# Patient Record
Sex: Male | Born: 1940
Health system: Southern US, Community
[De-identification: ages and names within clinical notes are randomized; demographics above are authoritative.]

## PROBLEM LIST (undated history)

## (undated) DIAGNOSIS — I219 Acute myocardial infarction, unspecified: Secondary | ICD-10-CM

## (undated) DIAGNOSIS — M109 Gout, unspecified: Secondary | ICD-10-CM

## (undated) DIAGNOSIS — E119 Type 2 diabetes mellitus without complications: Secondary | ICD-10-CM

## (undated) DIAGNOSIS — I10 Essential (primary) hypertension: Secondary | ICD-10-CM

## (undated) DIAGNOSIS — I2510 Atherosclerotic heart disease of native coronary artery without angina pectoris: Secondary | ICD-10-CM

## (undated) HISTORY — PX: COLONOSCOPY: SHX5424

## (undated) HISTORY — DX: Gout, unspecified: M10.9

## (undated) HISTORY — DX: Atherosclerotic heart disease of native coronary artery without angina pectoris: I25.10

## (undated) HISTORY — DX: Acute myocardial infarction, unspecified: I21.9

## (undated) HISTORY — PX: CARDIAC CATHETERIZATION: SHX172

---

## 2001-03-07 ENCOUNTER — Emergency Department (HOSPITAL_COMMUNITY): Admission: EM | Admit: 2001-03-07 | Discharge: 2001-03-07 | Payer: Self-pay | Admitting: Emergency Medicine

## 2001-03-07 ENCOUNTER — Encounter: Payer: Self-pay | Admitting: Emergency Medicine

## 2001-06-14 ENCOUNTER — Emergency Department (HOSPITAL_COMMUNITY): Admission: EM | Admit: 2001-06-14 | Discharge: 2001-06-14 | Payer: Self-pay | Admitting: Emergency Medicine

## 2001-06-14 ENCOUNTER — Encounter: Payer: Self-pay | Admitting: Emergency Medicine

## 2004-08-22 ENCOUNTER — Ambulatory Visit: Payer: Self-pay | Admitting: Gastroenterology

## 2004-09-05 ENCOUNTER — Ambulatory Visit: Payer: Self-pay | Admitting: Gastroenterology

## 2004-09-21 ENCOUNTER — Ambulatory Visit: Payer: Self-pay | Admitting: Gastroenterology

## 2005-04-04 ENCOUNTER — Emergency Department (HOSPITAL_COMMUNITY): Admission: EM | Admit: 2005-04-04 | Discharge: 2005-04-04 | Payer: Self-pay | Admitting: Emergency Medicine

## 2005-06-10 ENCOUNTER — Emergency Department (HOSPITAL_COMMUNITY): Admission: EM | Admit: 2005-06-10 | Discharge: 2005-06-11 | Payer: Self-pay | Admitting: Emergency Medicine

## 2005-09-18 ENCOUNTER — Encounter: Admission: RE | Admit: 2005-09-18 | Discharge: 2005-09-18 | Payer: Self-pay | Admitting: Family Medicine

## 2005-09-25 ENCOUNTER — Ambulatory Visit (HOSPITAL_COMMUNITY): Admission: RE | Admit: 2005-09-25 | Discharge: 2005-09-25 | Payer: Self-pay | Admitting: Family Medicine

## 2005-12-30 ENCOUNTER — Encounter: Admission: RE | Admit: 2005-12-30 | Discharge: 2005-12-30 | Payer: Self-pay | Admitting: Family Medicine

## 2005-12-31 ENCOUNTER — Encounter: Admission: RE | Admit: 2005-12-31 | Discharge: 2005-12-31 | Payer: Self-pay | Admitting: Family Medicine

## 2006-06-04 ENCOUNTER — Emergency Department (HOSPITAL_COMMUNITY): Admission: EM | Admit: 2006-06-04 | Discharge: 2006-06-04 | Payer: Self-pay | Admitting: Family Medicine

## 2006-11-29 ENCOUNTER — Emergency Department (HOSPITAL_COMMUNITY): Admission: EM | Admit: 2006-11-29 | Discharge: 2006-11-29 | Payer: Self-pay | Admitting: Emergency Medicine

## 2007-07-09 ENCOUNTER — Inpatient Hospital Stay (HOSPITAL_COMMUNITY): Admission: EM | Admit: 2007-07-09 | Discharge: 2007-07-11 | Payer: Self-pay | Admitting: Emergency Medicine

## 2008-03-18 ENCOUNTER — Ambulatory Visit (HOSPITAL_COMMUNITY): Admission: RE | Admit: 2008-03-18 | Discharge: 2008-03-18 | Payer: Self-pay | Admitting: Orthopaedic Surgery

## 2009-04-14 IMAGING — CR DG CHEST 1V PORT
1 series · 1 of 1 positions shown · non-contrast
Comparison: Two-view chest x-ray 11/29/2006.  CT chest 12/31/2005.

CLINICAL DATA: Chest pain.

PORTABLE CHEST - 1 VIEW [DATE]/[id number] [id number] hours:

[view not recorded]
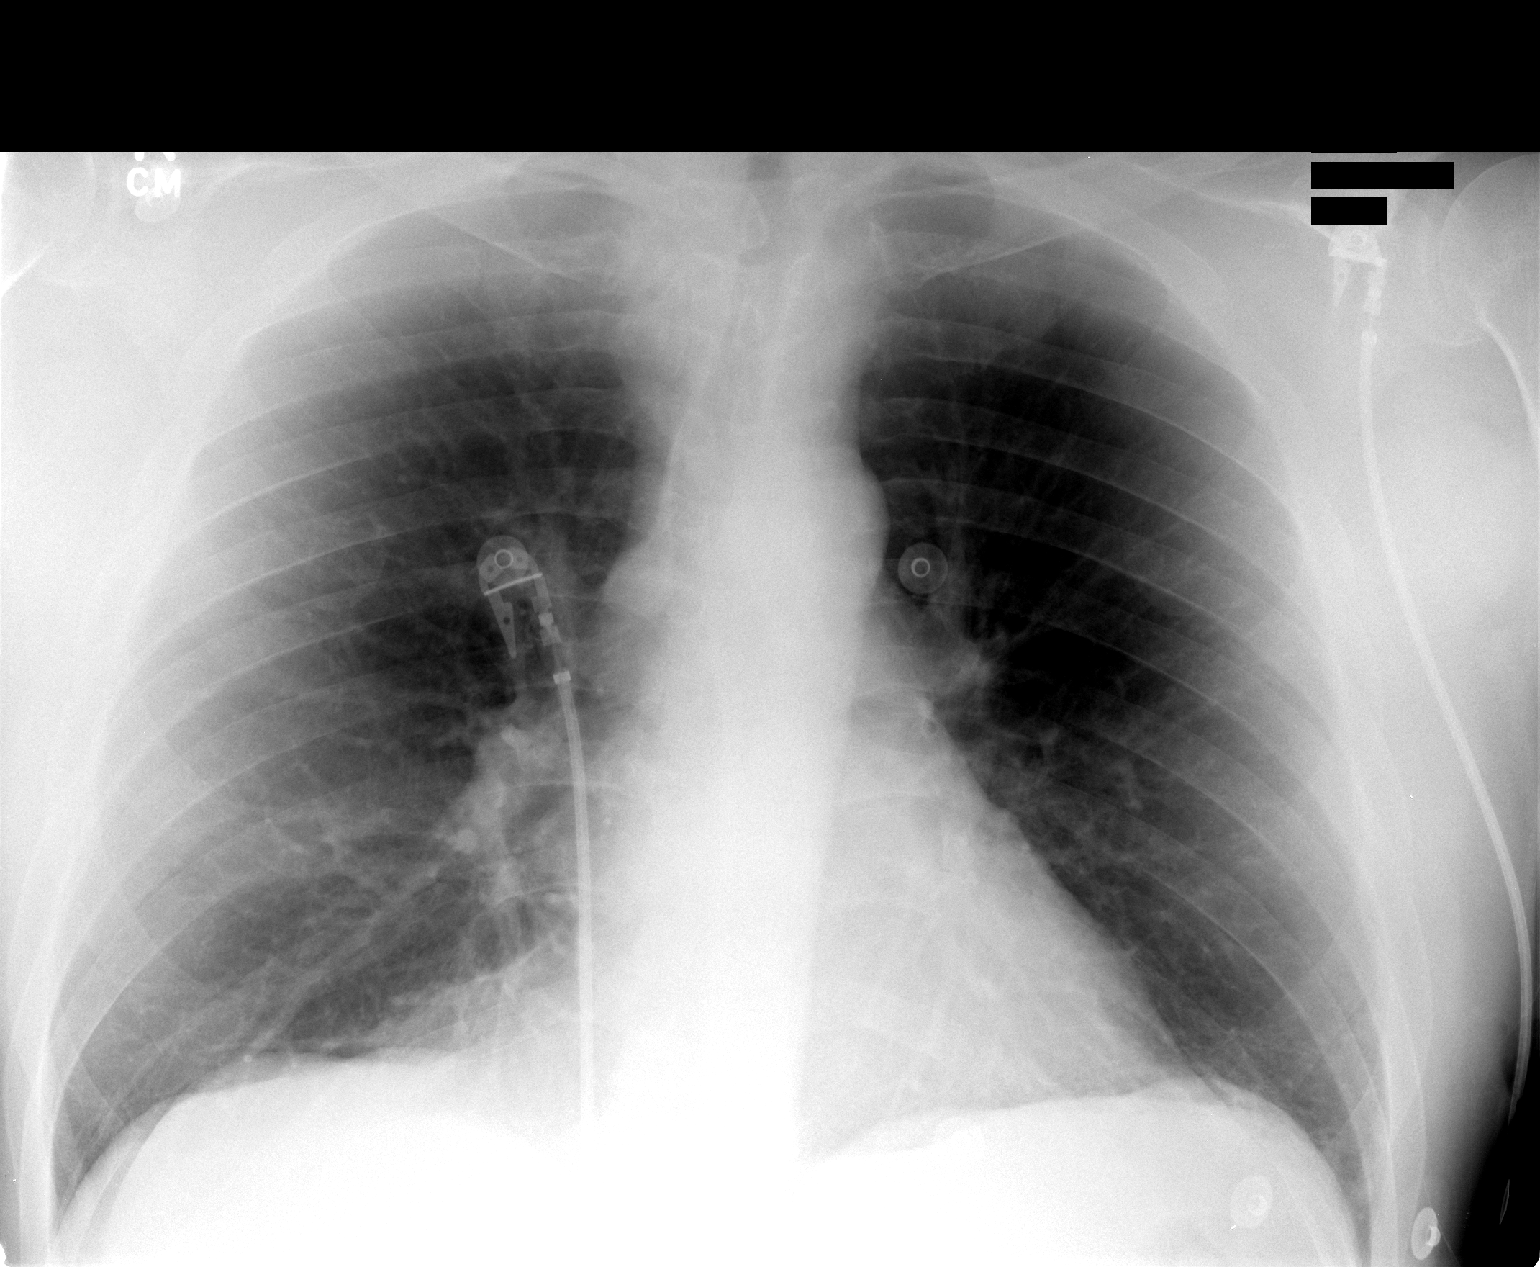

[1 of 1 positions shown; findings below may reference images not displayed]

FINDINGS: Heart size normal for the AP portable technique,
unchanged.  Pulmonary parenchyma clear.  No pleural effusions.
IMPRESSION: No acute cardiopulmonary disease.

## 2010-05-07 ENCOUNTER — Inpatient Hospital Stay (INDEPENDENT_AMBULATORY_CARE_PROVIDER_SITE_OTHER)
Admission: RE | Admit: 2010-05-07 | Discharge: 2010-05-07 | Disposition: A | Discharge status: Home / Self Care | Payer: MEDICARE | Source: Ambulatory Visit | Attending: Emergency Medicine | Admitting: Emergency Medicine

## 2010-05-07 DIAGNOSIS — R209 Unspecified disturbances of skin sensation: Secondary | ICD-10-CM

## 2010-06-25 ENCOUNTER — Other Ambulatory Visit: Payer: Self-pay | Admitting: Orthopedic Surgery

## 2010-06-25 ENCOUNTER — Ambulatory Visit
Admission: RE | Admit: 2010-06-25 | Discharge: 2010-06-25 | Disposition: A | Discharge status: Home / Self Care | Payer: MEDICARE | Source: Ambulatory Visit | Attending: Orthopedic Surgery | Admitting: Orthopedic Surgery

## 2010-06-25 DIAGNOSIS — R52 Pain, unspecified: Secondary | ICD-10-CM

## 2010-08-14 NOTE — Discharge Summary (Signed)
Jeremy Keller, Jeremy Keller                 ACCOUNT NO.:  [id number]   MEDICAL RECORD NO.:  [id number]          PATIENT TYPE:  INP   LOCATION:  2027                         FACILITY:  [facility]   PHYSICIAN:  Jeremy Hilts. Jacinto Halim, MD       DATE OF BIRTH:  [birth year]   DATE OF ADMISSION:  07/09/2007  DATE OF DISCHARGE:  07/11/2007                               DISCHARGE SUMMARY   DISCHARGE DIAGNOSES:  1. Non-ST elevation myocardial infarction.  2. Coronary artery disease with percutaneous transluminal coronary      angioplasty and stent deployment to the obtuse marginal 1 of the      circumflex.      a.     Residual coronary artery disease 50%  per intracoronary       vascular ultrasound in the left anterior descending,  more       proximal and 30-40% of the circ.      b.     Does have a 90% stenosis will be treated medically.  3. Metabolic syndrome.  4. Dyslipidemia  5. Hypertension controlled  6. Tobacco use.  7. Obstructive sleep apnea, has undergone sleep study March 2, 20009.   DISCHARGE CONDITION:  Improved.   PROCEDURES:  Cardiac catheterization on July 09, 2007, by Dr. Jacinto Keller.   Percutaneous transluminal coronary angioplasty and stent deployment of a  PROMUS drug-eluting stent to the obtuse marginal 1 of the circumflex,  reducing 90% stenosis to 0 by Dr. Jacinto Keller.  Please note ejection fraction  was 60%.   DISCHARGE MEDICATIONS:  1. Aspirin 325 mg daily.  2. Plavix 75 mg one daily do not stop.  Stopping could cause a heart      attack.  3. Norvasc 10 mg daily.  4. Crestor 40 mg daily.  5. Nitroglycerin sublingual under tongue if needed for chest pain.  6. Stop Caduet.  7. Bystolic 10 mg daily.  8. Cozaar 100 mg daily.  9. Protonix 40 mg daily.  10.Colchicine 0.6 mg twice a day.  11.Allopurinol 100 mg daily.   DISCHARGE INSTRUCTIONS:  1. Increase activity slowly.  May shower or bathe.  No lifting for 2      weeks.  No driving for 1 week.  2. Return to work after he see Dr. Jacinto Keller when  he has been cleared.  3. Low-sodium heart-healthy diabetic diet.  4. Wash right groin cath site with soap and water.  Call if any      bleeding, swelling or drainage.  5. Follow up with Dr. Jacinto Keller in 1-2 weeks in office, will call with      date and time.  6. No strenuous activity and please see Dr. Jacinto Keller.  7. No concentrated sweets, near borderline diabetic i.e. metabolic      syndrome.  Follow up with Dr. Concepcion Elk for this.   HISTORY OF PRESENT ILLNESS:  70 year old gentleman of Dr. Jacinto Keller had been  sent to [place] by Dr. Concepcion Elk for shortness of breath and abnormal stress  test.  He had uncontrolled hypertension.  He had had inferior wall  ischemia previously managed medically.  He underwent a sleep study as  well and was positive for obstructive sleep apnea.  If he does not have  CPAP,  we will have him follow up with Dr. Tresa Endo for this.   Then on July 09, 2007,  the patient presented to the emergency room with  chest pain.  Symptoms began on July 08, 2007, around 10:30 in the  evening, burning, substernal discomfort radiating back to.  He was short  of breath, nauseated but no diaphoresis.   He actually had not felt well for several weeks.  He complained of  fatigue and tiredness and decrease activity due to intolerance.  He was  admitted to [facility], placed on IV heparin, IV nitroglycerin.  His cardiac  markers were abnormal and that his troponin was 0.31 on admission.   The patient was then admitted to the CCU and or Step-Down Unit.   PAST MEDICAL HISTORY:  1. Hypertension.  2. Dyslipidemia.  3. Abnormal Myoview.  4. Gallop.  5. Erectile dysfunction.   FAMILY HISTORY:  Positive for coronary disease.   SOCIAL HISTORY, REVIEW OF SYSTEMS, OUTPATIENT MEDICATIONS:  See H&P.   PHYSICAL EXAMINATION ON DISCHARGE:  VITAL SIGNS:  Blood pressure 106/63,  pulse 61, respirations 20, temperature 97.3, oxygen saturation room air  95%.  LUNGS:  Clear.  CARDIOVASCULAR:  No murmur was noted on  his heart.  S1 and S2.  Regular  rate and rhythm.  EXTREMITIES:  No edema.   The cath site was without hematoma.   LABORATORY DATA:  Hemoglobin on admission 15.2, hematocrit 43.2, WBC was  up slightly at 11.3, platelets 254 and at discharge hemoglobin 13.6,  hematocrit 38.7, WBC 9.1, platelets 216.  Chemistry, sodium 139,  potassium 4.4, chloride 105, CO2 28, BUN 15, creatinine 1.05, glucose  124, was slightly up during hospitalization.  At discharge, sodium 141,  potassium 3.5, chloride 105, CO2 28, BUN 9, creatinine 1.06, glucose  110.  Coags on admission, pro-time 15.3, INR of 1.2, PTT 190.  GI: AST  29, ALT 24, alkaline phos 67, total bilirubin 0.7, albumin 3.5.  Cardiac  enzymes, initial troponin July 09, 2007, 1517, this is after the cardiac  markers, troponin was 4.97, CK 339, MB 17.6.  Had a gradual decrease.  Actually he peed on July 09, 2007 at 8:35 in the evening with a troponin  5.61, CK 292, MB 12.3.  Then continued to gradually declined and prior  to  discharge, CK 255, MB 3.8 and troponin I 2.85.   Total cholesterol 99, LDL 45, HDL 34 and triglycerides 98.  Electrolytes, calcium 8.9, magnesium 1.8.  TSH was 1.191.  Glycohemoglobin was slightly elevated d 6.3 and BNP was less than 30 on  admission.  Chest x-ray on admission, no acute cardiopulmonary process.   EKGs, on admission, sinus rhythm, incomplete left bundle branch block  and follow-up sinus bradycardia, otherwise, normal EKGs.  Cardiac cath  as described with 90% OM1 stenosis undergoing PTCA and stent deployment.  Please see dictated report for full details.  EF was 60%.   HOSPITAL COURSE:  The patient was admitted early morning hours of July 09, 2007, with chest pain, enzymes initially were positive.  He was  placed on IV heparin and nitroglycerin, underwent cardiac  catheterization in the same day and had stent deployment.   The patient continued to improve,  felt well.  He was found to be at  most  borderline diabetic, definitely, metabolic syndrome. I have  instructed him to  no concentrated sweets.  He will need follow-up with  his primary care for diabetes, tight management diet and nutrition  consult.   The patient also was diagnosed with obstructive sleep apnea and he will  need follow-up to see concerning CPAP.   At discharge, he had been on Lopressor in the hospital.  He is normally  on Bystolic.  I have resumed his Bystolic.   Please note on July 11, 2007, he was seen and discharged by Dr. Elsie Lincoln.      Darcella Gasman. [place], N.P.      Jeremy Hilts. Jacinto Halim, MD  Electronically Signed    LRI/MEDQ  D:  07/11/2007  T:  07/12/2007  Job:  [id number]   cc:   Fleet Contras, M.D.

## 2010-08-14 NOTE — Op Note (Signed)
NAMEVERNIS, EID                 ACCOUNT NO.:  [id number]   MEDICAL RECORD NO.:  [id number]          PATIENT TYPE:  AMB   LOCATION:  SDS                          FACILITY:  [facility]   PHYSICIAN:  Vanita Panda. Magnus Ivan, M.D.DATE OF BIRTH:  [birth year]   DATE OF PROCEDURE:  03/18/2008  DATE OF DISCHARGE:  03/18/2008                               OPERATIVE REPORT   PREOPERATIVE DIAGNOSIS:  Left knee lateral meniscal tear.   POSTOPERATIVE DIAGNOSES:  1. Left knee complex, posterior and mid body lateral meniscal tears.  2. A grade 2 chondromalacia, medial femoral condyle.  3. Grade 4 chondromalacia trochlear groove.  4. Grade 3 chondromalacia lateral femoral condyle.   PROCEDURE PERFORMED:  1. Left knee arthroscopy with debridement.  2. Left knee arthroscopic partial lateral meniscectomy.  3. Left knee arthroscopic chondroplasty, all 3 compartments.   SURGEON:  Vanita Panda. Magnus Ivan, M.D.   ANESTHESIA:  General.   BLOOD LOSS:  Minimal.   TOURNIQUET TIME:  30 minutes.   COMPLICATIONS:  None.   INDICATIONS:  Mr. Carlile is a 70 year old very active individual who had  developed an acute knee pain over the last 2 or 3 months.  This was  limiting his activities of daily living.  An MRI was then obtained and  it did show a complex tear at the lateral meniscus and just mild  degenerative changes in the knee.  He had excellent range of motion and  has had mild effusion on one occasion.  I recommended he undergo  arthroscopic surgery because I felt that total knee arthroplasty is not  warranted given the amount of cartilage remaining in his knee, also due  to the fact that his knee pain was more acute on onset.  The risk, and  benefits of the surgery was explained to him at length including the  risk of DVT and he agreed to proceed with surgery.   PROCEDURE IN DETAIL:  After informed consent was obtained, appropriate  left arm was marked.  He was brought to the operating room and  placed  supine on the operating table.  General anesthesia was obtained.  A  nonsterile tourniquet was placed on his upper left thigh and his left  leg was prepped and draped with DuraPrep and sterile drapes including a  sterile stockinette.  A lateral leg post was utilized as well and the  bed was raised and the leg was flexed out to the side of table.  A time  out was called, and he was again identified as the correct patient and  correct left knee.  An anterolateral portal was then made just lateral  to the patellar tendon at the level of the inferior pole of the patella.  The arthroscope was inserted and right away went to the medial  compartment and made a anteromedial portal.  The medial meniscus was  probed and found to be intact.  There was grade 3 changes in  chondromalacia of the medial femoral condyle.  The ACL was probed and  ACL and PCL were intact.  With the knee in the figure-four position  and  with the lateral compartment, there was complex tearing of the posterior  horn in the mid body of the lateral meniscus.  There was grade 4  chondromalacia change within the trochlear groove and lateral femoral  condyle as well.  An arthroscopic shaver was then placed and a  chondroplasty was carried out in all 3 compartments to get the areas of  cartilage back to a stable margin which I was able to do.  The patellar  itself had good cartilage.  I then used an arthroscopic shaver and up  cutting biters to smooth back the meniscus to a stable margin and the  peripheral aspect of the lateral meniscus was still intact and the  popliteus tendon was intact as well.  The tibia on both, the medial and  lateral compartment showed minimal changes as well.  Once the meniscus  was debrided back to a stable margin with a partial lateral  meniscectomy, I removed all instrumentation and allowed fluid to lavage  through the knee.  I then drained the knee of any effusion, closed the  portal sites  with interrupted 4-0 nylon suture.  I inserted a mixture of  clonidine, morphine, and Marcaine into the knee.  I did inflate the  tourniquet during the case and the tourniquet was let down at 30 minutes  and the toes did pink nicely.  A sterile dressing followed by Ace wrap  was applied.  The patient was awakened, extubated and taken to the  recovery room in stable condition.  All final counts were correct, and  there were no complications noted.      Vanita Panda. Magnus Ivan, M.D.  Electronically Signed     CYB/MEDQ  D:  03/18/2008  T:  03/19/2008  Job:  [id number]

## 2010-08-14 NOTE — Cardiovascular Report (Signed)
NAMESTEFFEN, HASE                 ACCOUNT NO.:  [id number]   MEDICAL RECORD NO.:  [id number]          PATIENT TYPE:  INP   LOCATION:  6529                         FACILITY:  [facility]   PHYSICIAN:  Cristy Hilts. Jacinto Halim, MD       DATE OF BIRTH:  1940/04/03   DATE OF PROCEDURE:  07/09/2007  DATE OF DISCHARGE:                            CARDIAC CATHETERIZATION   ATTENDING PHYSICIAN:  Dr. Louanna Raw.   PROCEDURES PERFORMED:  1. Left ventriculography.  2. Selective right and left coronary angiography.  3. Percutaneous transluminal coronary angioplasty and direct stenting      of the obtuse marginal branch of the circumflex coronary artery      with implantation of a 3.0 x 15-mm Promus stent.  4. Intravascular ultrasound interrogation of the left anterior      descending artery.   INDICATIONS:  Mr. Brondon Wann is a 70 year old gentleman with history of  hypertension, hyperlipidemia, and erectile dysfunction who has had  stress Myoview in the past, was negative for myocardial ischemia.  He  was admitted to the hospital yesterday with complaints of burning  sensation in his chest which started on 10:30 in the evening.  This was  radiating to his back.  He ruled in for myocardial infarction.  Given  his multiple cardiovascular risk factors and positive cardiac enzymes,  he was brought to the cardiac catheterization lab to evaluate his  coronary anatomy.  Intravascular ultrasound interrogation of the LAD was  performed because of haziness noted in the mid-LAD.   HEMODYNAMIC DATA:  The left ventricular pressure was 95/40 with end-  diastolic pressure of 13 mmHg.  Aortic pressure was 97/57 with a mean of  72 mmHg.  There was no pressure gradient across the aortic valve.   ANGIOGRAPHIC DATA:  Left ventricle:  Left ventricular systolic function  was normal with the ejection fraction of 60%.  There was no significant  mitral regurgitation.  Right coronary artery:  Right coronary is a nondominant  vessel with mild  luminal irregularities.  Left main coronary artery:  Left main coronary artery is a large vessel.  Distal left main has 20% stenoses.  LAD:  LAD is a large vessel.  There is proximally, the ostium of the LAD  has a 20%-30% stenosis followed by a 40%-50% stenosis in the mid-LAD.  The LAD gives origin to a very tiny diagonal one and a moderate-sized  diagonal 2 which measures 2.02-0.25 mm.  This diagonal 2 has a focal 90%  stenosis in the mid segment.  The mid-to-distal LAD has mild luminal  irregularity constituting 20-30% stenoses.  Circumflex artery:  Circumflex was a large vessel.  It gives origin to  large obtuse marginal 1 which has a focal 90% stenosis.  The circumflex  after the origin of obtuse marginal 1 has a mild haziness with 30%-40%  stenosis and mid-to-distal circumflex also shows 30%-40% stenoses.   INTERVENTION DATA:  Successful PTCA and stenting of the proximal obtuse  marginal 1 branch of the circumflex coronary artery with implantation of  3.0 x 15-mm Promus drug-eluting stent at  16 atmospheric pressure  postdilated with 3.25 x 12-mm Quantum Maverick at 16 atmospheric  pressure.  Overall the stenosis was reduced from 90% to 0%.  Post  procedure excellent results were noted without any residual luminal  obstruction.   IVUS LAD DATA:  The IVUS interrogation of the LAD revealed severe plaque  burden of the entire mid-to-proximal ostial LAD.  Just at the origin of  the diagonal 2 which is moderate-sized vessel, there is a 50% stenosis  which is not angiographically visualized and angiographically looks more  of 20%-30%.  The mid segment of the LAD has the 50% angiographic and  also 50%-60% IVUS stenosis and the lumen area measured 5.5 mm squared at  this tightest stenotic segment.  There was heavy plaque burden.  There  was also a focal ruptured plaque noted in the mid-LAD.  However, there  was no thrombus visualized.   The plaque extended all the way  to the ostium and into the distal left  main and this was about 20%-30%.  There was significant remodeling of  the LAD.   RECOMMENDATIONS:  The patient will need very aggressive risk  modification.  The diagonal lesion was left alone as the LAD plaque was  heavy  with a fear of plaque rupture with the device motion through the  vessel.  He will be treated aggressively with medical therapy.  If he  indeed does have angina or if he does have stress-induced ischemia in  the anterior wall, we could potentially consider angioplasty to his mid-  LAD.   A total of 185 mL of contrast was utilized for diagnostic angiography.   DIAGNOSTIC AND INTERVENTIONAL PROCEDURE:  Technique of procedure:  Under  usual sterile precautions using a 6-French right femoral arterial  access, a 6-French multipurpose B2 catheter was advanced to the  ascending aorta and then into the left ventricle.  Left ventriculography  was performed both in LAO and RAO projection and catheter then pulled  into the ascending aorta and right coronary selectively engaged and  angiography was performed.  Then, the left main coronary artery was  selectively engaged and angiography was performed.  The catheter then  pulled out of body.   TECHNIQUE OF INTERVENTION:  Using heparin and Integrilin for  anticoagulation, a 0.014-inch ATW guidewire was advanced into obtuse  marginal branch of the circumflex coronary artery and direct stenting  with a 3.0 x 15-mm Promus was performed at 16 atmospheric pressure for  about a minute.  Because of the waist in the middle, this was  postdilated with a 3.25 x 12-mm Quantum Maverick again at 16 atmospheric  pressure and angiography was repeated.  There was proximal spasm  initially noted which was relieved with intracoronary nitroglycerin.   Attention was then directed towards the LAD and the same guidewire was  utilized to cross into the LAD and IVUS interrogation of the LAD was  performed and  the data was carefully analyzed.  The wire was withdrawn,  angiography repeated, guide catheter disengaged and pulled out of body.   Right femoral arteriography was performed through the arterial access  sheath and access closed with Star close with excellent hemostasis.  The  patient tolerated the procedure.  No immediate complication noted.      Cristy Hilts. Jacinto Halim, MD  Electronically Signed     JRG/MEDQ  D:  07/09/2007  T:  07/10/2007  Job:  [id number]   cc:   Louanna Raw

## 2010-12-25 LAB — CBC
HCT: 38.7 — ABNORMAL LOW
HCT: 43.2
Hemoglobin: 13.4
Hemoglobin: 13.5
Hemoglobin: 13.6
MCHC: 33.8
MCHC: 35
MCV: 89.3
MCV: 89.4
MCV: 89.8
Platelets: 224
Platelets: 231
RBC: 4.3
RBC: 4.37
RBC: 4.84
RDW: 13.8
RDW: 13.8
RDW: 13.9
RDW: 14
WBC: 10.6 — ABNORMAL HIGH
WBC: 11.3 — ABNORMAL HIGH
WBC: 9.1

## 2010-12-25 LAB — PROTIME-INR
INR: 1.2
Prothrombin Time: 15.3 — ABNORMAL HIGH

## 2010-12-25 LAB — POCT I-STAT, CHEM 8
BUN: 17
Creatinine, Ser: 1.1
Glucose, Bld: 162 — ABNORMAL HIGH
Hemoglobin: 15
Potassium: 3.3 — ABNORMAL LOW
Sodium: 141

## 2010-12-25 LAB — DIFFERENTIAL
Eosinophils Absolute: 0.4
Eosinophils Relative: 4
Lymphocytes Relative: 42
Lymphocytes Relative: 44
Lymphs Abs: 4
Lymphs Abs: 4.7 — ABNORMAL HIGH
Monocytes Absolute: 0.7
Monocytes Relative: 7
Neutrophils Relative %: 47

## 2010-12-25 LAB — BASIC METABOLIC PANEL
BUN: 9
GFR calc non Af Amer: >60
Glucose, Bld: 110 — ABNORMAL HIGH
Potassium: 3.5

## 2010-12-25 LAB — MAGNESIUM: Magnesium: 1.8

## 2010-12-25 LAB — CARDIAC PANEL(CRET KIN+CKTOT+MB+TROPI)
CK, MB: 12.3 — ABNORMAL HIGH
CK, MB: 5.1 — ABNORMAL HIGH
Relative Index: 1.5
Relative Index: 4.2 — ABNORMAL HIGH
Relative Index: 5.2 — ABNORMAL HIGH
Troponin I: 3.13

## 2010-12-25 LAB — POCT CARDIAC MARKERS
CKMB, poc: 2.4
CKMB, poc: 2.8
Myoglobin, poc: 116
Myoglobin, poc: 81.3
Operator id: 272551
Troponin i, poc: 0.31 — ABNORMAL HIGH

## 2010-12-25 LAB — LIPID PANEL
LDL Cholesterol: 45
Total CHOL/HDL Ratio: 2.9
VLDL: 20

## 2010-12-25 LAB — COMPREHENSIVE METABOLIC PANEL
ALT: 24
ALT: 28
AST: 29
AST: 29
Albumin: 3.5
Albumin: 3.6
Alkaline Phosphatase: 67
Alkaline Phosphatase: 67
BUN: 8
Calcium: 9.1
GFR calc Af Amer: >60
Glucose, Bld: 124 — ABNORMAL HIGH
Potassium: 3.7
Potassium: 4.4
Sodium: 139
Sodium: 139
Total Protein: 6.2
Total Protein: 6.4

## 2010-12-25 LAB — B-NATRIURETIC PEPTIDE (CONVERTED LAB): Pro B Natriuretic peptide (BNP): <30

## 2010-12-25 LAB — TSH: TSH: 1.191

## 2010-12-25 LAB — CK TOTAL AND CKMB (NOT AT ARMC)
CK, MB: 14.9 — ABNORMAL HIGH
Relative Index: 4.9 — ABNORMAL HIGH

## 2011-01-03 LAB — CBC
MCHC: 33.6 g/dL (ref 30.0–36.0)
MCV: 90.1 fL (ref 78.0–100.0)
Platelets: 219 10*3/uL (ref 150–400)

## 2011-01-03 LAB — BASIC METABOLIC PANEL
BUN: 12 mg/dL (ref 6–23)
CO2: 24 mEq/L (ref 19–32)
Chloride: 107 mEq/L (ref 96–112)
Creatinine, Ser: 0.85 mg/dL (ref 0.4–1.5)
Glucose, Bld: 146 mg/dL — ABNORMAL HIGH (ref 70–99)

## 2011-01-03 LAB — PROTIME-INR: Prothrombin Time: 14.3 seconds (ref 11.6–15.2)

## 2011-08-31 ENCOUNTER — Emergency Department (HOSPITAL_COMMUNITY)
Admission: EM | Admit: 2011-08-31 | Discharge: 2011-09-01 | Disposition: A | Discharge status: Home / Self Care | Payer: No Typology Code available for payment source | Attending: Emergency Medicine | Admitting: Emergency Medicine

## 2011-08-31 ENCOUNTER — Encounter (HOSPITAL_COMMUNITY): Payer: Self-pay | Admitting: Emergency Medicine

## 2011-08-31 DIAGNOSIS — I10 Essential (primary) hypertension: Secondary | ICD-10-CM | POA: Insufficient documentation

## 2011-08-31 DIAGNOSIS — Z043 Encounter for examination and observation following other accident: Secondary | ICD-10-CM | POA: Insufficient documentation

## 2011-08-31 HISTORY — DX: Essential (primary) hypertension: I10
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
## 2011-09-01 MED ORDER — METHOCARBAMOL 500 MG PO TABS: 500.0000 mg | ORAL_TABLET | Freq: Two times a day (BID) | ORAL | Status: AC
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
## 2011-09-01 NOTE — ED Provider Notes (Signed)
Medical screening examination/treatment/procedure(s) were performed by non-physician practitioner and as supervising physician I was immediately available for consultation/collaboration. Asra Gambrel, MD, FACEP   Charmelle Soh L Dung Salinger, MD 09/01/11 0650 
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
## 2012-02-06 ENCOUNTER — Institutional Professional Consult (permissible substitution): Payer: Medicare HMO | Admitting: Pulmonary Disease

## 2013-09-13 ENCOUNTER — Ambulatory Visit (INDEPENDENT_AMBULATORY_CARE_PROVIDER_SITE_OTHER): Payer: Medicare HMO | Admitting: Podiatry

## 2013-09-13 ENCOUNTER — Encounter: Payer: Self-pay | Admitting: Podiatry

## 2013-09-13 ENCOUNTER — Ambulatory Visit (INDEPENDENT_AMBULATORY_CARE_PROVIDER_SITE_OTHER): Payer: Medicare HMO

## 2013-09-13 VITALS — BP 118/68 | HR 95 | Resp 15 | Ht 72.0 in | Wt 207.0 lb

## 2013-09-13 DIAGNOSIS — M109 Gout, unspecified: Secondary | ICD-10-CM | POA: Insufficient documentation

## 2013-09-13 DIAGNOSIS — M722 Plantar fascial fibromatosis: Secondary | ICD-10-CM

## 2013-09-13 MED ORDER — TRIAMCINOLONE ACETONIDE 10 MG/ML IJ SUSP
10.0000 mg | Freq: Once | INTRAMUSCULAR | Status: AC
  Administered 2013-09-13: 10 mg

## 2013-09-13 NOTE — Progress Notes (Signed)
   Subjective:    Patient ID: Jeremy Keller, male    DOB: 04/08/1940, 73 y.o.   MRN: [id number]  HPI Comments: N heel pain L right heel and inferior arch D mid-April 2015 O sudden C aching A worse after resting and morning T Dr Julio Sickssei-Bonsu injected, and epsom salt soaks   This patient describes 1 local injection into the right heel at 3 different locations resulting in increased right heel pain. 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      Objective:   Physical Exam  Orientated x3 black male  Vascular: DP is 1/4 bilaterally PTs 2/4 bilaterally  Neurological: Sensation to 10 g monofilament wire intact 2/5 bilaterally Ankle reflexes equal and reactive bilaterally Vibratory sensation nonreactive left and reactive right  Dermatological: Texture and turgor within normal limits  Musculoskeletal: No deformities noted Palpable tenderness in the medial plantar inferior right heel without a palpable lesions. The fat pad in the heel appears adequate. This area duplicates her area of discomfort. There is no erythema, edema, warmth noted in the right heel area.  X-ray which is not visible at the time of this dictation reveals no fracture and/or dislocation noted in the right heel area from x-ray of 09/13/2013 No spur formation was noted on the inferior right hee.      Assessment & Plan:   Assessment: Plantar fasciitis right Sensory neuropathy bilaterally \ Plan: Skin is prepped with alcohol and Betadine and 10 mg of Kenalog mixed with 10 mg of plain Xylocaine and 2.5 mg of plain Xylocaine were injected inferior heel right for Kenalog injection #1 and corticosteroid injection #2 from previous Dr.  Patient was advised to purchase a walking shoe with a rigid heel  counter and purchase an over-the-counter soft insole. Advised to take acetaminophen when necessary pain control   Reappoint x30 days if symptoms are not improving

## 2013-09-13 NOTE — Patient Instructions (Signed)
  Use over-the-counter common Tylenol as needed for pain control. Do not exceed the dosage marked on the bottom Purchase a pair of walking shoes with his sturdy heel counter 2 pairs of shoes for a year suggested Rotate shoes  Plantar Fasciitis Plantar fasciitis is a common condition that causes foot pain. It is soreness (inflammation) of the band of tough fibrous tissue on the bottom of the foot that runs from the heel bone (calcaneus) to the ball of the foot. The cause of this soreness may be from excessive standing, poor fitting shoes, running on hard surfaces, being overweight, having an abnormal walk, or overuse (this is common in runners) of the painful foot or feet. It is also common in aerobic exercise dancers and ballet dancers. SYMPTOMS  Most people with plantar fasciitis complain of:  Severe pain in the morning on the bottom of their foot especially when taking the first steps out of bed. This pain recedes after a few minutes of walking.  Severe pain is experienced also during walking following a long period of inactivity.  Pain is worse when walking barefoot or up stairs DIAGNOSIS   Your caregiver will diagnose this condition by examining and feeling your foot.  Special tests such as X-rays of your foot, are usually not needed. PREVENTION   Consult a sports medicine professional before beginning a new exercise program.  Walking programs offer a good workout. With walking there is a lower chance of overuse injuries common to runners. There is less impact and less jarring of the joints.  Begin all new exercise programs slowly. If problems or pain develop, decrease the amount of time or distance until you are at a comfortable level.  Wear good shoes and replace them regularly.  Stretch your foot and the heel cords at the back of the ankle (Achilles tendon) both before and after exercise.  Run or exercise on even surfaces that are not hard. For example, asphalt is better than  pavement.  Do not run barefoot on hard surfaces.  If using a treadmill, vary the incline.  Do not continue to workout if you have foot or joint problems. Seek professional help if they do not improve. HOME CARE INSTRUCTIONS   Avoid activities that cause you pain until you recover.  Use ice or cold packs on the problem or painful areas after working out.  Only take over-the-counter or prescription medicines for pain, discomfort, or fever as directed by your caregiver.  Soft shoe inserts or athletic shoes with air or gel sole cushions may be helpful.  If problems continue or become more severe, consult a sports medicine caregiver or your own health care provider. Cortisone is a potent anti-inflammatory medication that may be injected into the painful area. You can discuss this treatment with your caregiver. MAKE SURE YOU:   Understand these instructions.  Will watch your condition.  Will get help right away if you are not doing well or get worse. Document Released: 12/11/2000 Document Revised: 06/10/2011 Document Reviewed: 02/10/2008 [facility] Patient Information 2014 [place], [place].

## 2014-03-07 ENCOUNTER — Ambulatory Visit (INDEPENDENT_AMBULATORY_CARE_PROVIDER_SITE_OTHER): Payer: Medicare HMO | Admitting: Podiatry

## 2014-03-07 ENCOUNTER — Encounter: Payer: Self-pay | Admitting: Podiatry

## 2014-03-07 VITALS — BP 150/84 | HR 78 | Resp 13 | Ht 72.0 in | Wt 203.0 lb

## 2014-03-07 DIAGNOSIS — G629 Polyneuropathy, unspecified: Secondary | ICD-10-CM

## 2014-03-07 NOTE — Patient Instructions (Signed)

## 2014-03-07 NOTE — Progress Notes (Signed)
   Subjective:    Patient ID: Jeremy Keller, male    DOB: 09/20/1940, 73 y.o.   MRN: [id number]  HPI Comments: N numbness L left hindfoot D 2 - 3 months O worsening after back surgery possible C numb sensation A notices more when seated T none  Pt states he is beginning to have heel pain again in the right heel like in June 2015.  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 He has changes shoes on wears supportive work style boots.    Review of Systems  All other systems reviewed and are negative.      Objective:   Physical Exam  Orientated 3  Vascular: DP pulses 2/4 bilaterally PT pulses 4/4 bilaterally  Neurological: Vibratory sensation nonreactive bilaterally Sensation to 10 g monofilament wire intact 4/5 right and 2/5 left Ankle reflex equal and reactive bilaterally  Dermatological: Texture turgor within normal limits  Musculoskeletal: Mild palpable tenderness medial plantar fascial area right without any palpable lesions Stable gait without limping  Manual motor testing: Dorsi flexion, plantar flexion, inversion, eversion 5/5 bilaterally    Assessment & Plan:   Assessment: Peripheral neuropathy (sensory) undetermined origin Low-grade plantar fasciitis right  Plan: Advised patient that his symptoms are suggestive of neuropathy of undetermined origin. At this time I recommended that he continue to observe this area of numbness and if progressing would refer to neurology for further evaluation.  Patient will continue stretching aware supportive shoes when working for the plantar fasciitis right. If the right plantar fasciitis pain significantly increased return for further treatment which could include foot orthotics and possibly another Kenalog injection

## 2014-03-08 ENCOUNTER — Encounter: Payer: Self-pay | Admitting: Podiatry

## 2014-09-22 ENCOUNTER — Emergency Department (HOSPITAL_COMMUNITY)
Admission: EM | Admit: 2014-09-22 | Discharge: 2014-09-22 | Disposition: A | Discharge status: Home / Self Care | Payer: Medicare (Managed Care) | Attending: Emergency Medicine | Admitting: Emergency Medicine

## 2014-09-22 ENCOUNTER — Encounter (HOSPITAL_COMMUNITY): Payer: Self-pay | Admitting: *Deleted

## 2014-09-22 ENCOUNTER — Emergency Department (HOSPITAL_COMMUNITY): Payer: Medicare (Managed Care)

## 2014-09-22 DIAGNOSIS — I2510 Atherosclerotic heart disease of native coronary artery without angina pectoris: Secondary | ICD-10-CM | POA: Diagnosis not present

## 2014-09-22 DIAGNOSIS — E119 Type 2 diabetes mellitus without complications: Secondary | ICD-10-CM | POA: Diagnosis not present

## 2014-09-22 DIAGNOSIS — Z87891 Personal history of nicotine dependence: Secondary | ICD-10-CM | POA: Insufficient documentation

## 2014-09-22 DIAGNOSIS — Z7982 Long term (current) use of aspirin: Secondary | ICD-10-CM | POA: Insufficient documentation

## 2014-09-22 DIAGNOSIS — Z79899 Other long term (current) drug therapy: Secondary | ICD-10-CM | POA: Insufficient documentation

## 2014-09-22 DIAGNOSIS — I10 Essential (primary) hypertension: Secondary | ICD-10-CM | POA: Diagnosis not present

## 2014-09-22 DIAGNOSIS — M109 Gout, unspecified: Secondary | ICD-10-CM | POA: Diagnosis not present

## 2014-09-22 DIAGNOSIS — I252 Old myocardial infarction: Secondary | ICD-10-CM | POA: Insufficient documentation

## 2014-09-22 DIAGNOSIS — R079 Chest pain, unspecified: Secondary | ICD-10-CM | POA: Diagnosis present

## 2014-09-22 HISTORY — DX: Type 2 diabetes mellitus without complications: E11.9

## 2014-09-22 LAB — CBC
HEMATOCRIT: 41.5 % (ref 39.0–52.0)
HEMOGLOBIN: 14.4 g/dL (ref 13.0–17.0)
MCH: 30.7 pg (ref 26.0–34.0)
MCHC: 34.7 g/dL (ref 30.0–36.0)
MCV: 88.5 fL (ref 78.0–100.0)
PLATELETS: 211 10*3/uL (ref 150–400)
RBC: 4.69 MIL/uL (ref 4.22–5.81)
RDW: 13.2 % (ref 11.5–15.5)
WBC: 6.6 10*3/uL (ref 4.0–10.5)

## 2014-09-22 LAB — I-STAT TROPONIN, ED
Troponin i, poc: 0 ng/mL (ref 0.00–0.08)
Troponin i, poc: 0 ng/mL (ref 0.00–0.08)

## 2014-09-22 LAB — BASIC METABOLIC PANEL
Anion gap: 9 (ref 5–15)
BUN: 19 mg/dL (ref 6–20)
CALCIUM: 9 mg/dL (ref 8.9–10.3)
CO2: 22 mmol/L (ref 22–32)
Chloride: 105 mmol/L (ref 101–111)
Creatinine, Ser: 1.2 mg/dL (ref 0.61–1.24)
GFR, EST NON AFRICAN AMERICAN: 58 mL/min — AB (ref >60)
GLUCOSE: 113 mg/dL — AB (ref 65–99)
POTASSIUM: 4.1 mmol/L (ref 3.5–5.1)
SODIUM: 136 mmol/L (ref 135–145)

## 2014-09-22 MED ORDER — ASPIRIN 81 MG PO CHEW: 324.0000 mg | CHEWABLE_TABLET | Freq: Once | ORAL | Status: DC

## 2014-09-22 NOTE — ED Notes (Signed)
Pt states started having chest pain this am to the left side of his chest and non-radiating.  No sob.

## 2014-09-22 NOTE — Discharge Instructions (Signed)

## 2014-09-22 NOTE — ED Provider Notes (Signed)
CSN: [id number]     Arrival date & time 09/22/14  1435 History   First MD Initiated Contact with Patient 09/22/14 1836     Chief Complaint  Patient presents with  . Chest Pain    Patient is a 74 y.o. male presenting with chest pain. The history is provided by the patient.  Chest Pain Pain location:  L chest Pain quality: sharp   Pain radiates to:  Does not radiate Duration:  2 minutes Timing:  Intermittent Chronicity:  New Context: not breathing   Context comment:  Does not seem to get worse with walking Relieved by:  Nothing Ineffective treatments:  None tried Risk factors: coronary artery disease   Pt has history of CAD with prior stent.  MI was in 2008.  Pt's symptoms were mild when he had a heart attack.  He thought he was having indigestion.  Past Medical History  Diagnosis Date  . Hypertension   . Gout   . Diabetes mellitus without complication    History reviewed. No pertinent past surgical history. No family history on file. History  Substance Use Topics  . Smoking status: Former Games developer  . Smokeless tobacco: Not on file  . Alcohol Use: Yes     Comment: 1 beer per day    Review of Systems  Cardiovascular: Positive for chest pain.  All other systems reviewed and are negative.     Allergies  Review of patient's allergies indicates no known allergies.  Home Medications   Prior to Admission medications   Medication Sig Start Date End Date Taking? Authorizing Provider  allopurinol (ZYLOPRIM) 100 MG tablet Take 100 mg by mouth 2 (two) times daily. 09/08/14  Yes Historical Provider, MD  amLODipine (NORVASC) 10 MG tablet Take 5 mg by mouth daily.    Yes Historical Provider, MD  aspirin 325 MG tablet Take 325 mg by mouth daily.   Yes Historical Provider, MD  losartan (COZAAR) 100 MG tablet Take 100 mg by mouth daily. 09/08/14  Yes Historical Provider, MD  metFORMIN (GLUCOPHAGE) 1000 MG tablet Take 1,000 mg by mouth 2 (two) times daily. 09/15/14  Yes Historical  Provider, MD  omeprazole (PRILOSEC) 40 MG capsule Take 40 mg by mouth daily.   Yes Historical Provider, MD   BP 146/78 mmHg  Pulse 66  Temp(Src) 98 F (36.7 C) (Oral)  Resp 13  SpO2 94% Physical Exam  Constitutional: He appears well-developed and well-nourished. No distress.  HENT:  Head: Normocephalic and atraumatic.  Right Ear: External ear normal.  Left Ear: External ear normal.  Eyes: Conjunctivae are normal. Right eye exhibits no discharge. Left eye exhibits no discharge. No scleral icterus.  Neck: Neck supple. No tracheal deviation present.  Cardiovascular: Normal rate, regular rhythm and intact distal pulses.   Pulmonary/Chest: Effort normal and breath sounds normal. No stridor. No respiratory distress. He has no wheezes. He has no rales.  Abdominal: Soft. Bowel sounds are normal. He exhibits no distension. There is no tenderness. There is no rebound and no guarding.  Musculoskeletal: He exhibits no edema or tenderness.  Neurological: He is alert. He has normal strength. No cranial nerve deficit (no facial droop, extraocular movements intact, no slurred speech) or sensory deficit. He exhibits normal muscle tone. He displays no seizure activity. Coordination normal.  Skin: Skin is warm and dry. No rash noted.  Psychiatric: He has a normal mood and affect.  Nursing note and vitals reviewed.   ED Course  Procedures (including critical care time) Labs  Review Labs Reviewed  BASIC METABOLIC PANEL - Abnormal; Notable for the following:    Glucose, Bld 113 (*)    GFR calc non Af Amer 58 (*)    All other components within normal limits  CBC  I-STAT TROPOININ, ED  Rosezena Sensor, ED    Imaging Review Dg Chest 2 View  09/22/2014   CLINICAL DATA:  Intermittent left chest pain beginning today. Initial encounter.  EXAM: CHEST  2 VIEW  COMPARISON:  CT chest 12/31/2005. PA and lateral chest 11/29/2006 and 03/15/2008. Single view of the chest 07/09/2007.  FINDINGS: The lungs are  emphysematous. There is some prominence of the pulmonary interstitium. No consolidative process, pneumothorax or effusion is identified. No focal bony abnormality is seen.  IMPRESSION: Emphysema without acute disease.   Electronically Signed   By: Drusilla Kanner M.D.   On: 09/22/2014 15:35     EKG Interpretation   Date/Time:  Thursday September 22 2014 14:44:03 EDT Ventricular Rate:  77 PR Interval:  172 QRS Duration: 98 QT Interval:  366 QTC Calculation: 414 R Axis:   -17 Text Interpretation:  Normal sinus rhythm Possible Anterior infarct , age  undetermined Abnormal ECG No significant change since last tracing  Confirmed by Adryana Mogensen  MD-J, Elanie Hammitt ([id number]) on 09/22/2014 6:52:26 PM      MDM   Final diagnoses:  Chest pain, unspecified chest pain type    I recommended having the patient admitted to the hospital for serial cardiac enzymes and further evaluation. Patient states he does not want to stay in the hospital overnight. He is not having any chest pain. He feels comfortable calling Dr. Jacinto Halim tomorrow to arrange follow-up. I encouraged the patient to return to emergency room immediately if he has any recurrent episodes. I expect bleeding tell important it was for him to arrange close follow-up.    Linwood Dibbles, MD 09/22/14 2031

## 2016-02-23 ENCOUNTER — Encounter (HOSPITAL_COMMUNITY): Payer: Self-pay

## 2016-02-23 ENCOUNTER — Emergency Department (HOSPITAL_COMMUNITY): Payer: Medicare Other

## 2016-02-23 ENCOUNTER — Emergency Department (HOSPITAL_COMMUNITY)
Admission: EM | Admit: 2016-02-23 | Discharge: 2016-02-23 | Disposition: A | Discharge status: Home / Self Care | Payer: Medicare Other | Attending: Emergency Medicine | Admitting: Emergency Medicine

## 2016-02-23 DIAGNOSIS — Z7982 Long term (current) use of aspirin: Secondary | ICD-10-CM | POA: Diagnosis not present

## 2016-02-23 DIAGNOSIS — Z87891 Personal history of nicotine dependence: Secondary | ICD-10-CM | POA: Diagnosis not present

## 2016-02-23 DIAGNOSIS — I10 Essential (primary) hypertension: Secondary | ICD-10-CM | POA: Diagnosis not present

## 2016-02-23 DIAGNOSIS — E119 Type 2 diabetes mellitus without complications: Secondary | ICD-10-CM | POA: Insufficient documentation

## 2016-02-23 DIAGNOSIS — Z79899 Other long term (current) drug therapy: Secondary | ICD-10-CM | POA: Insufficient documentation

## 2016-02-23 DIAGNOSIS — Z7984 Long term (current) use of oral hypoglycemic drugs: Secondary | ICD-10-CM | POA: Insufficient documentation

## 2016-02-23 DIAGNOSIS — M25552 Pain in left hip: Secondary | ICD-10-CM | POA: Insufficient documentation

## 2016-02-23 MED ORDER — DICLOFENAC SODIUM 1 % TD GEL: 2.0000 g | Freq: Four times a day (QID) | TRANSDERMAL | 0 refills | Status: DC

## 2016-02-23 NOTE — ED Provider Notes (Signed)
[facility]-EMERGENCY DEPT Provider Note   CSN: [id number] Arrival date & time: 02/23/16  [id number]  History   Chief Complaint Chief Complaint  Patient presents with  . Hip Pain    HPI Ridley L Johnnette BarriosMock is a 75 y.o. male.  HPI  75 y.o. male with a hx of DM, HTN, presents to the Emergency Department today complaining of left hip pain x 1 month. States that the pain is intermittent. No pain on ambulation. Does not endorse any falls or trauma to area. Notes pain is worse when sitting down. Minimal pain when standing and with lying down. No numbness/tingling. Rates pain 2/10 currently in ED while lying down. Attempted ibuprofen with moderate relief. No N/V. No CP/SOB. No fevers. No other symptoms noted.   Past Medical History:  Diagnosis Date  . Diabetes mellitus without complication (HCC)   . Gout   . Hypertension     Patient Active Problem List   Diagnosis Date Noted  . Gout     History reviewed. No pertinent surgical history.     Home Medications    Prior to Admission medications   Medication Sig Start Date End Date Taking? Authorizing Provider  allopurinol (ZYLOPRIM) 100 MG tablet Take 100 mg by mouth 2 (two) times daily. 09/08/14   Historical Provider, MD  amLODipine (NORVASC) 10 MG tablet Take 5 mg by mouth daily.     Historical Provider, MD  aspirin 325 MG tablet Take 325 mg by mouth daily.    Historical Provider, MD  losartan (COZAAR) 100 MG tablet Take 100 mg by mouth daily. 09/08/14   Historical Provider, MD  metFORMIN (GLUCOPHAGE) 1000 MG tablet Take 1,000 mg by mouth 2 (two) times daily. 09/15/14   Historical Provider, MD  omeprazole (PRILOSEC) 40 MG capsule Take 40 mg by mouth daily.    Historical Provider, MD    Family History History reviewed. No pertinent family history.  Social History Social History  Substance Use Topics  . Smoking status: Former Games developermoker  . Smokeless tobacco: Never Used  . Alcohol use Yes     Comment: 1 beer per day     Allergies   Patient has no  known allergies.   Review of Systems Review of Systems ROS reviewed and all are negative for acute change except as noted in the HPI.  Physical Exam Updated Vital Signs BP 156/99 (BP Location: Right Arm)   Pulse 84   Temp 97.5 F (36.4 C) (Oral)   Resp 16   SpO2 98%   Physical Exam  Constitutional: He is oriented to person, place, and time. Vital signs are normal. He appears well-developed and well-nourished.  HENT:  Head: Normocephalic.  Right Ear: Hearing normal.  Left Ear: Hearing normal.  Eyes: Conjunctivae and EOM are normal. Pupils are equal, round, and reactive to light.  Neck: Normal range of motion. Neck supple.  Cardiovascular: Normal rate, regular rhythm, normal heart sounds and intact distal pulses.   Pulmonary/Chest: Effort normal and breath sounds normal.  Musculoskeletal: Normal range of motion.  Left Hip NVI. Distal pulses appreciated. ROM intact. No pain on internal/external rotation. TTP around superior iliac crest. No erythema. No ecchymosis.  Neurological: He is alert and oriented to person, place, and time.  Skin: Skin is warm and dry.  Psychiatric: He has a normal mood and affect. His speech is normal and behavior is normal. Thought content normal.  Nursing note and vitals reviewed.  ED Treatments / Results  Labs (all labs ordered are listed, but  only abnormal results are displayed) Labs Reviewed - No data to display  EKG  EKG Interpretation None      Radiology Dg Hip Unilat W Or Wo Pelvis 2-3 Views Left  Result Date: 02/23/2016 CLINICAL DATA:  Left hip discomfort EXAM: DG HIP (WITH OR WITHOUT PELVIS) 2-3V LEFT COMPARISON:  None. FINDINGS: Three views of the left hip submitted. No acute fracture or subluxation. Mild degenerative changes are noted bilateral hip joints with superior acetabular sclerosis. Mild spurring of left superior acetabulum. Mild degenerative changes pubic symphysis. IMPRESSION: No acute fracture or subluxation.  Mild  degenerative changes. Electronically Signed   By: Natasha MeadLiviu  Pop M.D.   On: 02/23/2016 11:22    Procedures Procedures (including critical care time)  Medications Ordered in ED Medications - No data to display   Initial Impression / Assessment and Plan / ED Course  I have reviewed the triage vital signs and the nursing notes.  Pertinent labs & imaging results that were available during my care of the patient were reviewed by me and considered in my medical decision making (see chart for details).  Clinical Course    Final Clinical Impressions(s) / ED Diagnoses   {I have reviewed and evaluated the relevant imaging studies.  {I have reviewed the relevant previous healthcare records.  {I obtained HPI from historian. {Patient discussed with supervising physician.  ED Course:  Assessment: Pt is a 75yM with hx HTN, DM who presents with left hip pain x 1 month. Worse with sitting. No pain with lying down or ambulation. On exam, pt in NAD. Nontoxic/nonseptic appearing. VSS. Afebrile. Lungs CTA. Heart RRR. Left hip NVI. Distal pulses appreciated. TTP along illiac crest. No erythema. No ecchymosis. Ambulates well. DG left hip with no acute fracture/dislocation. Did show mild spurring. Discussed with attending physician. Plan is to DC home with follow up to Orthopedics for evaluation. At time of discharge, Patient is in no acute distress. Vital Signs are stable. Patient is able to ambulate. Patient able to tolerate PO.   Disposition/Plan:  DC Home Additional Verbal discharge instructions given and discussed with patient.  Pt Instructed to f/u with Ortho in the next week for evaluation and treatment of symptoms. Return precautions given Pt acknowledges and agrees with plan  Supervising Physician Linwood DibblesJon Knapp, MD  Final diagnoses:  Left hip pain    New Prescriptions New Prescriptions   No medications on file     Audry Piliyler Angel Weedon, PA-C 02/23/16 1138    Linwood DibblesJon Knapp, MD 02/24/16 [phone]

## 2016-02-23 NOTE — Discharge Instructions (Signed)
Please read and follow all provided instructions.  Your diagnoses today include:  1. Left hip pain     Tests performed today include: Vital signs. See below for your results today.   Medications prescribed:  Take as prescribed   Home care instructions:  Follow any educational materials contained in this packet.  Follow-up instructions: Please follow-up with your primary care provider for further evaluation of symptoms and treatment   Return instructions:  Please return to the Emergency Department if you do not get better, if you get worse, or new symptoms OR  - Fever (temperature greater than 101.52F)  - Bleeding that does not stop with holding pressure to the area    -Severe pain (please note that you may be more sore the day after your accident)  - Chest Pain  - Difficulty breathing  - Severe nausea or vomiting  - Inability to tolerate food and liquids  - Passing out  - Skin becoming red around your wounds  - Change in mental status (confusion or lethargy)  - New numbness or weakness    Please return if you have any other emergent concerns.  Additional Information:  Your vital signs today were: BP 156/99 (BP Location: Right Arm)    Pulse 84    Temp 97.5 F (36.4 C) (Oral)    Resp 16    SpO2 98%  If your blood pressure (BP) was elevated above 135/85 this visit, please have this repeated by your doctor within one month. ---------------

## 2016-02-23 NOTE — ED Notes (Signed)
ED Provider at bedside. 

## 2016-02-23 NOTE — ED Triage Notes (Signed)
Pt reports left side hip pain that began yesterday. Pt denies back pain and denies injury. Pain denies any radiation of the pain.

## 2016-03-12 ENCOUNTER — Ambulatory Visit (INDEPENDENT_AMBULATORY_CARE_PROVIDER_SITE_OTHER): Payer: Medicare Other | Admitting: Orthopaedic Surgery

## 2016-03-12 DIAGNOSIS — M7062 Trochanteric bursitis, left hip: Secondary | ICD-10-CM

## 2016-03-12 DIAGNOSIS — M25552 Pain in left hip: Secondary | ICD-10-CM

## 2016-03-12 MED ORDER — LIDOCAINE HCL 1 % IJ SOLN
3.0000 mL | INTRAMUSCULAR | Status: AC | PRN
  Administered 2016-03-12: 3 mL

## 2016-03-12 MED ORDER — METHYLPREDNISOLONE ACETATE 40 MG/ML IJ SUSP
40.0000 mg | INTRAMUSCULAR | Status: AC | PRN
  Administered 2016-03-12: 40 mg via INTRA_ARTICULAR

## 2016-03-12 NOTE — Progress Notes (Signed)
Office Visit Note   Patient: Jeremy Keller           Date of Birth: 12/24/1940           MRN: [id number] Visit Date: 03/12/2016              Requested by: Jackie PlumGeorge Osei-Bonsu, MD [address] [place], [place] [id number] PCP: Jackie PlumSEI-BONSU,GEORGE, MD   Assessment & Plan: Visit Diagnoses:  1. Pain of left hip joint     Plan: We're going to treat this for now as IT band syndrome and trochanteric bursitis. I provided injections over the IT band the trochanteric areas most point tender with a steroid and lidocaine mixture. He tolerated these well. We'll see him back in 4 weeks' o see how he is doing overall. At that visit we may address his right knee since she was letting me know as he was leaving his right knee is bothering him.  Follow-Up Instructions: Return in about 4 weeks (around 04/09/2016).   Orders:  No orders of the defined types were placed in this encounter.  No orders of the defined types were placed in this encounter.     Procedures: Large Joint Inj Date/Time: 03/12/2016 9:52 AM Performed by: Kathryne HitchBLACKMAN, Shirleyann Montero Y Authorized by: Kathryne HitchBLACKMAN, Panda Crossin Y   Location:  Hip Site:  L greater trochanter Ultrasound Guidance: No   Fluoroscopic Guidance: No   Arthrogram: No   Medications:  3 mL lidocaine 1 %; 40 mg methylPREDNISolone acetate 40 MG/ML     Clinical Data: No additional findings.   Subjective: Chief Complaint  Patient presents with  . Left Hip - Pain    Patient states he has had hip pain for sometime now, but they day before Thanksgiving he couldn't walk. He went to the ER and they referred him here    HPI He said his pain is only been a short time, and he points to his IT band on his left side as well as his trochanteric area. He denies any pain in the groin. He does have x-rays on the cone system for me to review of his pelvis and his left hip. Review of Systems He denies any headache, shortness of breath, fever, chills, nausea,  vomiting.  Objective: Vital Signs: There were no vitals taken for this visit.  Physical Exam He is alert and oriented 3 in no acute distress Ortho Exam Gets up on the exam table easily. Both hips have stiff range of motion but no pain in the groin at all. His left side shows pain down the IT band and trochanteric area only. Specialty Comments:  No specialty comments available.  Imaging: No results found. An AP pelvis and lateral of his right hip and left hip are reviewed on the cone system and shows some slight joint space narrowing some signs of femoral acetabular impingement but it is mild. There is no irregularities around the trochanteric area.  PMFS History: Patient Active Problem List   Diagnosis Date Noted  . Gout    Past Medical History:  Diagnosis Date  . Diabetes mellitus without complication (HCC)   . Gout   . Hypertension     No family history on file.  No past surgical history on file. Social History   Occupational History  . Not on file.   Social History Main Topics  . Smoking status: Former Games developermoker  . Smokeless tobacco: Never Used  . Alcohol use Yes     Comment: 1 beer  per day  . Drug use: No  . Sexual activity: Not on file

## 2016-03-13 ENCOUNTER — Ambulatory Visit (INDEPENDENT_AMBULATORY_CARE_PROVIDER_SITE_OTHER): Payer: Medicare HMO | Admitting: Orthopaedic Surgery

## 2016-03-18 ENCOUNTER — Ambulatory Visit (INDEPENDENT_AMBULATORY_CARE_PROVIDER_SITE_OTHER): Payer: Medicare HMO | Admitting: Physician Assistant

## 2016-04-10 ENCOUNTER — Ambulatory Visit (INDEPENDENT_AMBULATORY_CARE_PROVIDER_SITE_OTHER): Payer: Medicare Other | Admitting: Orthopaedic Surgery

## 2016-04-10 DIAGNOSIS — M7062 Trochanteric bursitis, left hip: Secondary | ICD-10-CM | POA: Insufficient documentation

## 2016-04-10 NOTE — Progress Notes (Signed)
The patient is following up 1 month after I performed a trochanteric injection of a steroid over his left hip greater trochanteric area due to bursitis. He said he is significantly better since then. He 76 years old. He says when he doesn't angulate long distances does cause some pain still. He denies any pain in the groin. [place] examination of his left hip he only has a mild amount of pain over the trochanteric area but the remainder of his left lower extremity exam is normal.  At this point given the fact that his trochanteric bursitis seems to be resolving orders: Have him follow up as needed. I told him to continue stretching exercises and to consider an injection again in a month or 2 if he still sore.

## 2016-06-28 IMAGING — CR DG CHEST 2V
2 series · 2 of 2 positions shown · non-contrast
Comparison: CT chest 12/31/2005.

CLINICAL DATA: Intermittent left chest pain beginning today.
Initial encounter.

EXAM:
CHEST  2 VIEW

[w chest pa]
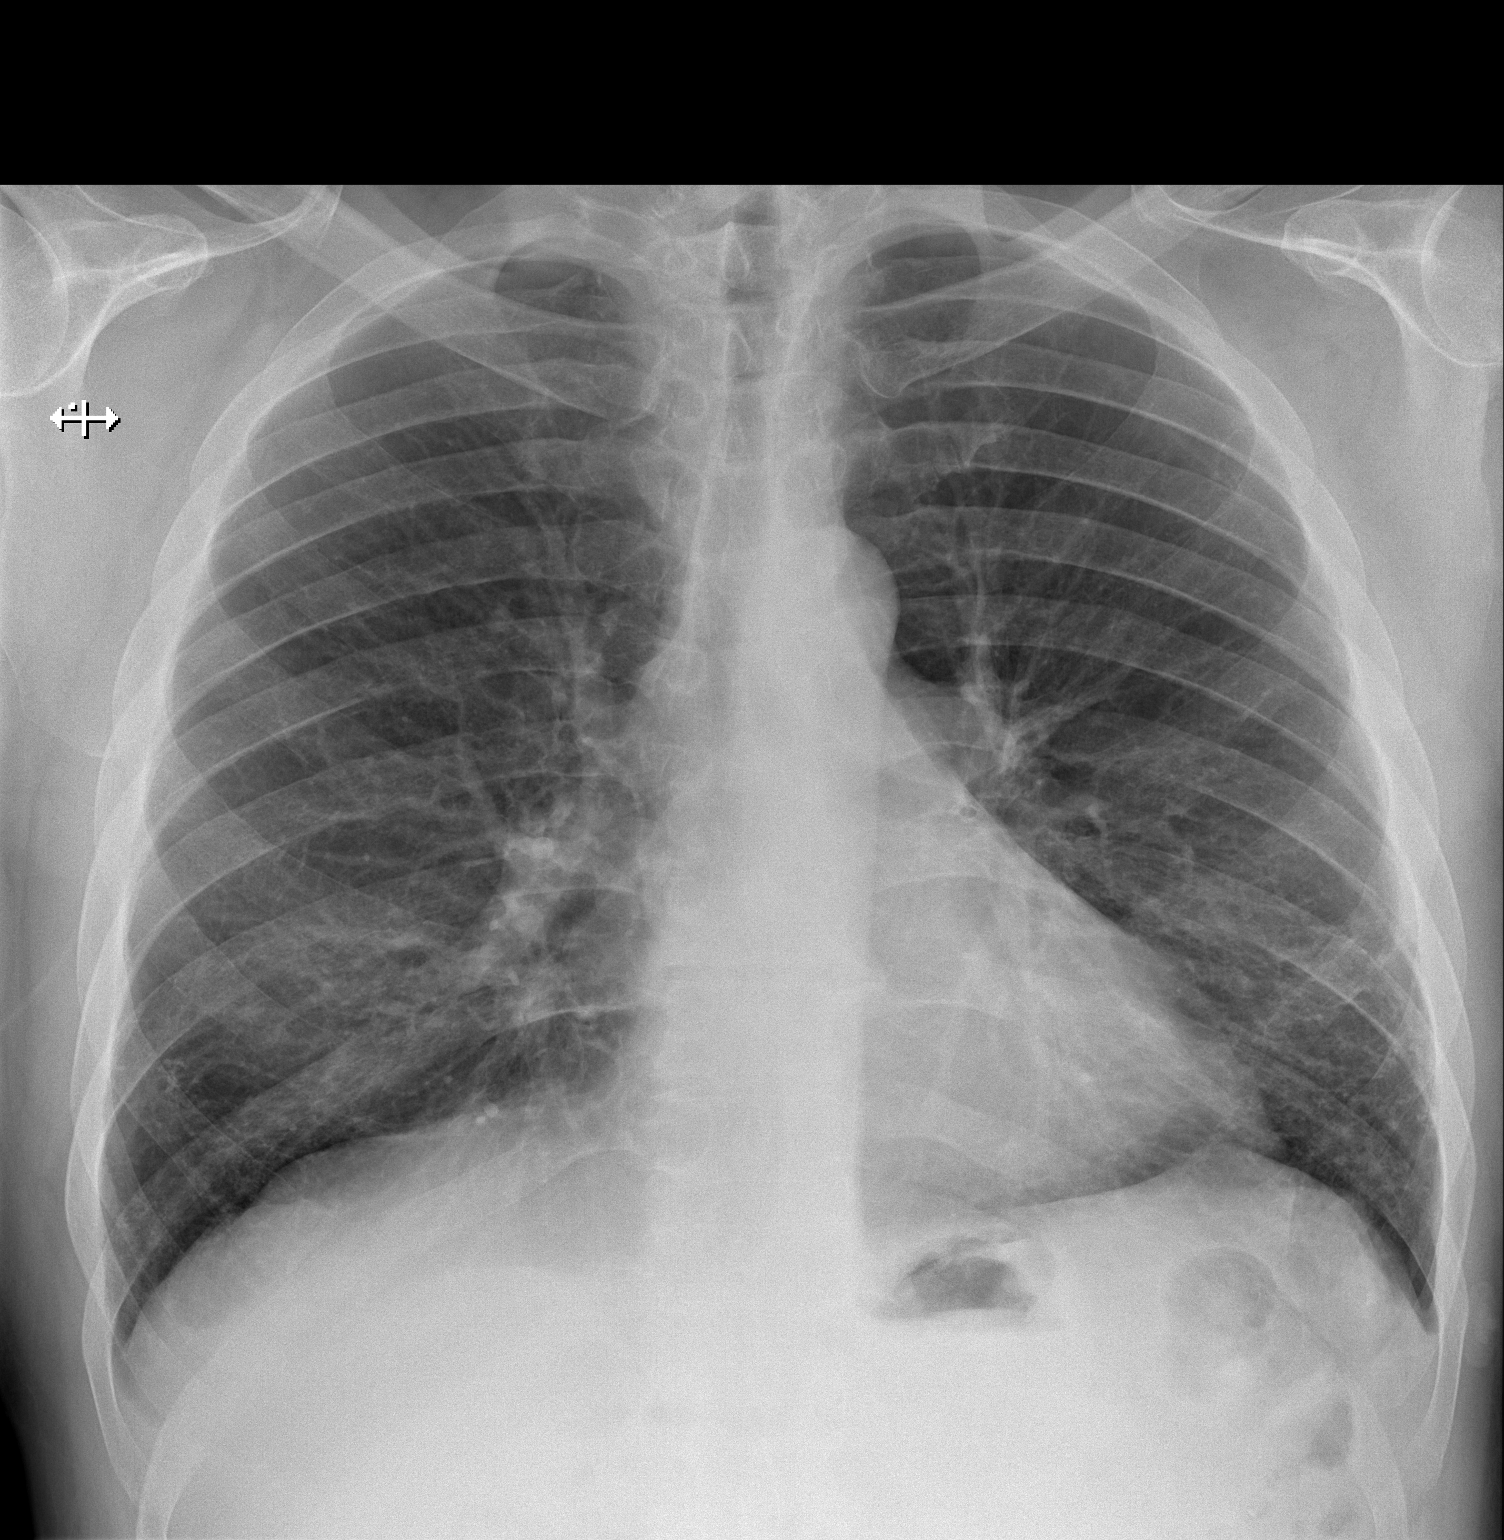

[w chest lat]
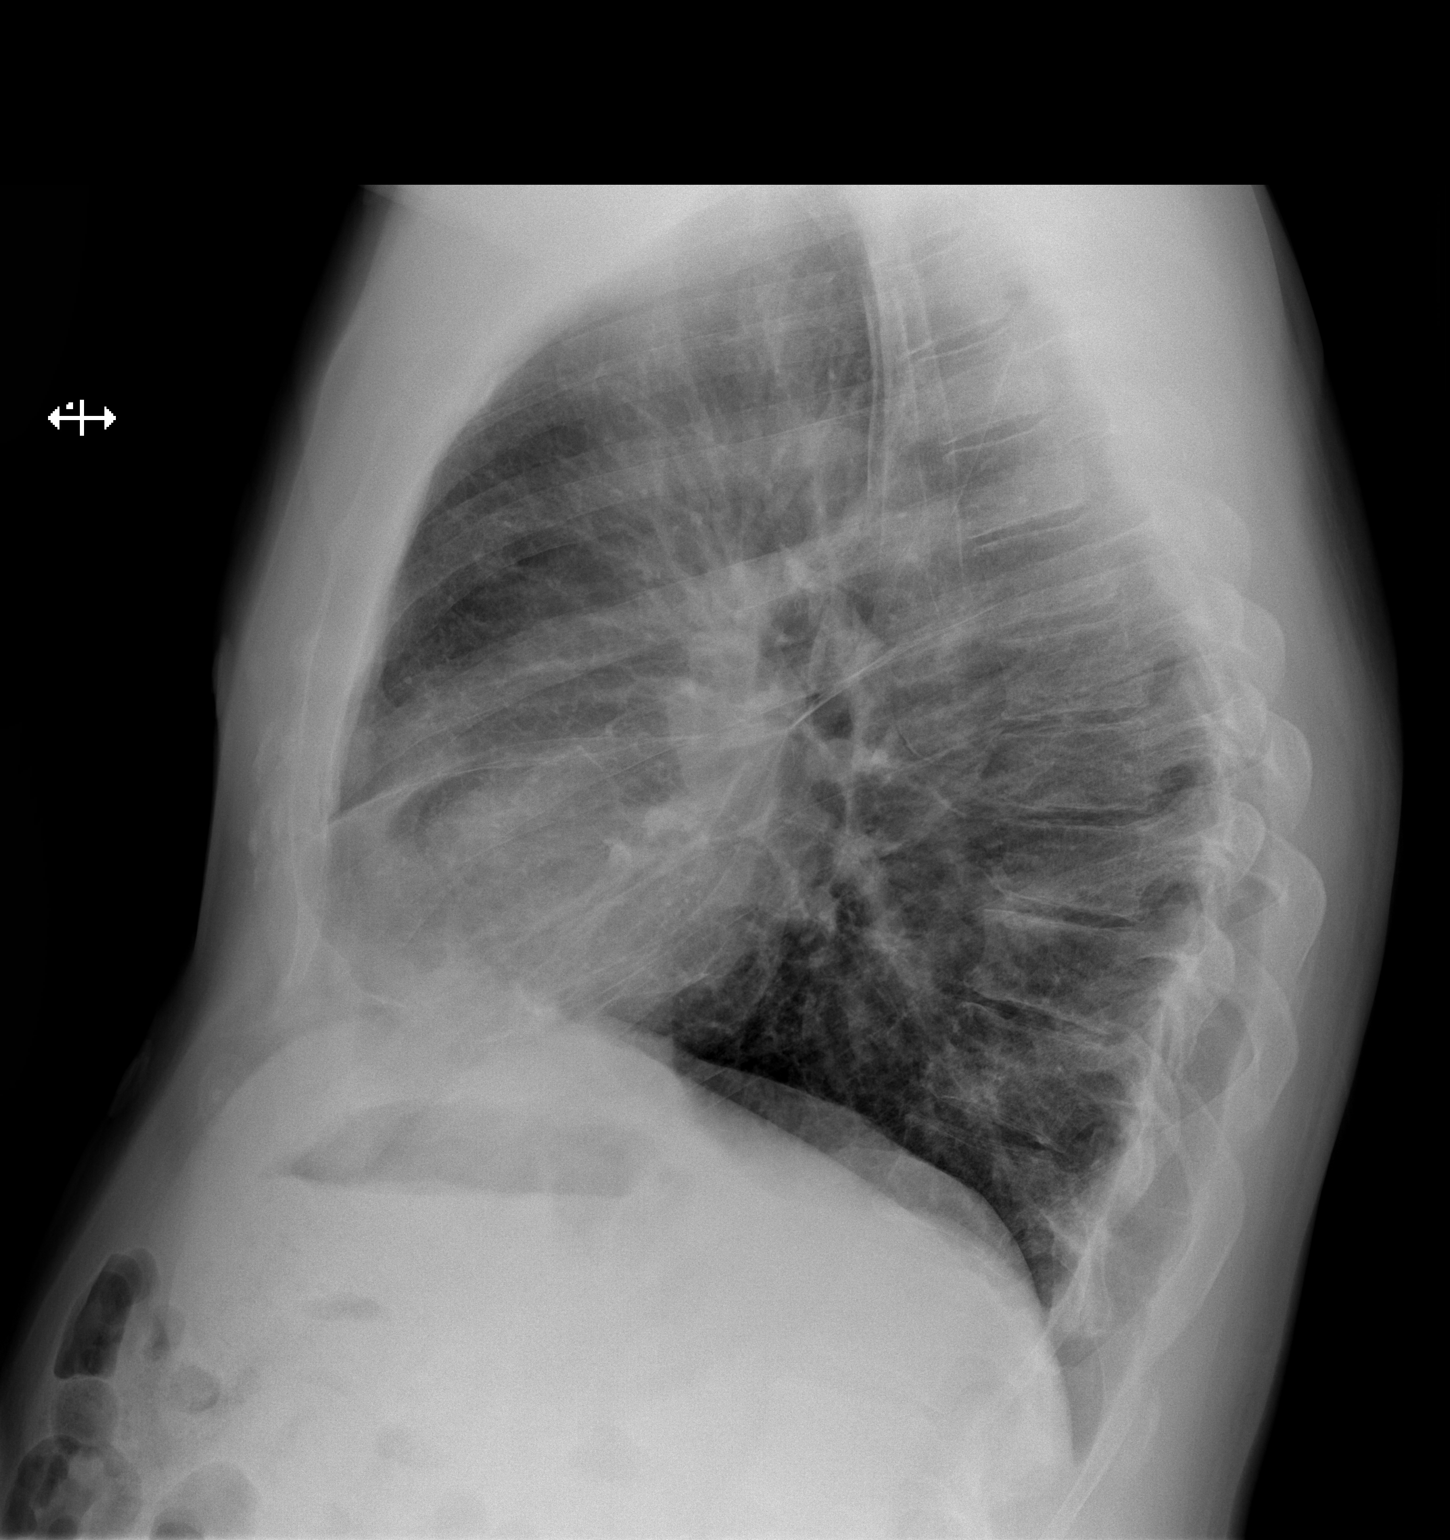

[2 of 2 positions shown; findings below may reference images not displayed]

PA and lateral chest 11/29/2006
and 03/15/2008. Single view of the chest 07/09/2007.
FINDINGS: The lungs are emphysematous. There is some prominence of the
pulmonary interstitium. No consolidative process, pneumothorax or
effusion is identified. No focal bony abnormality is seen.
IMPRESSION: Emphysema without acute disease.

## 2016-10-16 ENCOUNTER — Other Ambulatory Visit: Payer: Self-pay | Admitting: Internal Medicine

## 2016-10-16 ENCOUNTER — Ambulatory Visit
Admission: RE | Admit: 2016-10-16 | Discharge: 2016-10-16 | Disposition: A | Discharge status: Home / Self Care | Payer: Medicare Other | Source: Ambulatory Visit | Attending: Internal Medicine | Admitting: Internal Medicine

## 2016-10-16 DIAGNOSIS — M778 Other enthesopathies, not elsewhere classified: Secondary | ICD-10-CM

## 2017-06-25 ENCOUNTER — Ambulatory Visit
Admission: RE | Admit: 2017-06-25 | Discharge: 2017-06-25 | Disposition: A | Discharge status: Home / Self Care | Payer: Medicare Other | Source: Ambulatory Visit | Attending: Internal Medicine | Admitting: Internal Medicine

## 2017-06-25 ENCOUNTER — Other Ambulatory Visit: Payer: Self-pay | Admitting: Internal Medicine

## 2017-06-25 DIAGNOSIS — M79645 Pain in left finger(s): Secondary | ICD-10-CM

## 2017-07-09 ENCOUNTER — Encounter (INDEPENDENT_AMBULATORY_CARE_PROVIDER_SITE_OTHER): Payer: Self-pay | Admitting: Orthopaedic Surgery

## 2017-07-09 ENCOUNTER — Ambulatory Visit (INDEPENDENT_AMBULATORY_CARE_PROVIDER_SITE_OTHER): Payer: Medicare Other | Admitting: Orthopaedic Surgery

## 2017-07-09 DIAGNOSIS — M7062 Trochanteric bursitis, left hip: Secondary | ICD-10-CM

## 2017-07-09 DIAGNOSIS — M25552 Pain in left hip: Secondary | ICD-10-CM | POA: Insufficient documentation

## 2017-07-09 MED ORDER — METHYLPREDNISOLONE ACETATE 40 MG/ML IJ SUSP
40.0000 mg | INTRAMUSCULAR | Status: AC | PRN
  Administered 2017-07-09: 40 mg via INTRA_ARTICULAR

## 2017-07-09 MED ORDER — LIDOCAINE HCL 1 % IJ SOLN
3.0000 mL | INTRAMUSCULAR | Status: AC | PRN
  Administered 2017-07-09: 3 mL

## 2017-07-09 NOTE — Progress Notes (Signed)
Office Visit Note   Patient: Jeremy Keller           Date of Birth: [birth year]           MRN: [id number] Visit Date: 07/09/2017              Requested by: Georgann Housekeeper, MD [address] [place], [place] [address] PCP: Georgann Housekeeper, MD   Assessment & Plan: Visit Diagnoses:  1. Pain in left hip   2. Trochanteric bursitis, left hip     Plan: I agree with him trying a steroid injection around the trochanteric area of his left hip since this lasted for a year and a half.  This is what he like as well.  He understands the risk and benefits of injections and he tolerated this well.  All questions and concerns were answered and addressed.  He will follow-up as needed.  Follow-Up Instructions: Return if symptoms worsen or fail to improve.   Orders:  No orders of the defined types were placed in this encounter.  No orders of the defined types were placed in this encounter.     Procedures: Large Joint Inj: L greater trochanter on 07/09/2017 5:34 PM Indications: pain and diagnostic evaluation Details: 22 G 1.5 in needle, lateral approach  Arthrogram: No  Medications: 3 mL lidocaine 1 %; 40 mg methylPREDNISolone acetate 40 MG/ML Outcome: tolerated well, no immediate complications Procedure, treatment alternatives, risks and benefits explained, specific risks discussed. Consent was given by the patient. Immediately prior to procedure a time out was called to verify the correct patient, procedure, equipment, support staff and site/side marked as required. Patient was prepped and draped in the usual sterile fashion.       Clinical Data: No additional findings.   Subjective: Chief Complaint  Patient presents with  . Left Hip - Pain  The patient is someone that I have not seen in a year and half.  This was for trochanteric bursitis of his left hip.  We placed a steroid injection in this area and he got excellent relief.  He is only started hurting pain in this area  again recently and he would like to have a steroid shot again today.  He is not interested in any other intervention.  He does have some moderate arthritis in his left hip.  HPI  Review of Systems  He currently denies any recent illnesses.  He denies any headache, chest pain, shortness of breath, fever, chills, nausea, vomiting. Objective: Vital Signs: There were no vitals taken for this visit.  Physical Exam he is alert and oriented x3 and in no acute distress Ortho Exam Examination of his left hip does show some stiffness with internal/external rotation but his pain is mainly over trochanteric area to palpation. Specialty Comments:  No specialty comments available.  Imaging: No results found.   PMFS History: Patient Active Problem List   Diagnosis Date Noted  . Pain in left hip 07/09/2017  . Trochanteric bursitis, left hip 04/10/2016  . Gout    Past Medical History:  Diagnosis Date  . Diabetes mellitus without complication (HCC)   . Gout   . Hypertension     History reviewed. No pertinent family history.  History reviewed. No pertinent surgical history. Social History   Occupational History  . Not on file  Tobacco Use  . Smoking status: Former Games developer  . Smokeless tobacco: Never Used  Substance and Sexual Activity  . Alcohol use: Yes  Comment: 1 beer per day  . Drug use: No  . Sexual activity: Not on file

## 2018-08-11 ENCOUNTER — Encounter: Payer: Self-pay | Admitting: Cardiology

## 2018-08-11 ENCOUNTER — Ambulatory Visit (INDEPENDENT_AMBULATORY_CARE_PROVIDER_SITE_OTHER): Payer: Medicare Other | Admitting: Cardiology

## 2018-08-11 ENCOUNTER — Other Ambulatory Visit: Payer: Self-pay

## 2018-08-11 VITALS — BP 136/77 | HR 93 | Ht 72.0 in | Wt 198.5 lb

## 2018-08-11 DIAGNOSIS — R9431 Abnormal electrocardiogram [ECG] [EKG]: Secondary | ICD-10-CM | POA: Diagnosis not present

## 2018-08-11 DIAGNOSIS — I739 Peripheral vascular disease, unspecified: Secondary | ICD-10-CM | POA: Insufficient documentation

## 2018-08-11 DIAGNOSIS — I2510 Atherosclerotic heart disease of native coronary artery without angina pectoris: Secondary | ICD-10-CM | POA: Diagnosis not present

## 2018-08-11 DIAGNOSIS — I10 Essential (primary) hypertension: Secondary | ICD-10-CM | POA: Diagnosis not present

## 2018-08-11 DIAGNOSIS — E7800 Pure hypercholesterolemia, unspecified: Secondary | ICD-10-CM

## 2018-08-11 DIAGNOSIS — R739 Hyperglycemia, unspecified: Secondary | ICD-10-CM

## 2018-08-11 NOTE — Progress Notes (Signed)
Primary Physician/Referring:  Georgann Housekeeper, MD  Patient ID: Jeremy Keller, male    DOB: 12/01/1940, 78 y.o.   MRN: [id number]  Chief Complaint  Patient presents with  . PAD  . Coronary Artery Disease  . Follow-up    HPI: Jeremy Keller  is a 78 y.o. male  with coronary artery disease and angioplasty and stenting to his circumflex coronary artery in 2009. Past medical history significant for prior tobacco use disorder, he quit smoking in 2007, probably 10-pack-year history, hypertension, hyperlipidemia and hyperglycemia.  He now presents for annual visit.  Except for marked decrease in exercise capacity and physical activity due to severe claudication involving his left calf, states that he would like to have a handicap plycard.   He has had a low risk stress test in 2016 revealing apical lateral and inferolateral ischemia in the distribution of circumflex and probably diagonal branch of the LAD. Since being on aggressive medical therapy he has not had significant angina. No dyspnea, PND or orthopnea.   Past Medical History:  Diagnosis Date  . Coronary artery disease   . Diabetes mellitus without complication (HCC)   . Gout   . Myocardial infarct [facility])     Past Surgical History:  Procedure Laterality Date  . CARDIAC CATHETERIZATION      Social History   Socioeconomic History  . Marital status: Divorced    Spouse name: Not on file  . Number of children: 14  . Years of education: Not on file  . Highest education level: Not on file  Occupational History  . Not on file  Social Needs  . Financial resource strain: Not on file  . Food insecurity:    Worry: Not on file    Inability: Not on file  . Transportation needs:    Medical: Not on file    Non-medical: Not on file  Tobacco Use  . Smoking status: Former Smoker    Packs/day: 1.00    Types: Cigarettes    Last attempt to quit: 2007    Years since quitting: 13.3  . Smokeless tobacco: Never Used  Substance and Sexual  Activity  . Alcohol use: Yes    Comment: occ  . Drug use: No  . Sexual activity: Not on file  Lifestyle  . Physical activity:    Days per week: Not on file    Minutes per session: Not on file  . Stress: Not on file  Relationships  . Social connections:    Talks on phone: Not on file    Gets together: Not on file    Attends religious service: Not on file    Active member of club or organization: Not on file    Attends meetings of clubs or organizations: Not on file    Relationship status: Not on file  . Intimate partner violence:    Fear of current or ex partner: Not on file    Emotionally abused: Not on file    Physically abused: Not on file    Forced sexual activity: Not on file  Other Topics Concern  . Not on file  Social History Narrative  . Not on file    Review of Systems  Constitution: Negative for chills, decreased appetite, malaise/fatigue and weight gain.  Cardiovascular: Positive for claudication (left calf). Negative for dyspnea on exertion, leg swelling, palpitations and syncope.  Endocrine: Negative for cold intolerance.  Hematologic/Lymphatic: Does not bruise/bleed easily.  Musculoskeletal: Positive for joint pain (bilateral thumbs) and  muscle cramps (night cramps bilateral, left worse). Negative for joint swelling.  Gastrointestinal: Negative for abdominal pain, anorexia and change in bowel habit.  Genitourinary: Positive for decreased libido (ED).  Neurological: Negative for headaches and light-headedness.  Psychiatric/Behavioral: Negative for depression and substance abuse.  All other systems reviewed and are negative.     Objective  Blood pressure 136/77, pulse 93, height 6' (1.829 m), weight 198 lb 8 oz (90 kg), SpO2 98 %. Body mass index is 26.92 kg/m.    Physical Exam  Constitutional: He appears well-developed and well-nourished. No distress.  HENT:  Head: Atraumatic.  Eyes: Conjunctivae are normal.  Neck: Neck supple. No JVD present. No  thyromegaly present.  Cardiovascular: Normal rate, regular rhythm and normal heart sounds. Exam reveals decreased pulses. Exam reveals no gallop.  No murmur heard. Pulses:      Carotid pulses are 2+ on the right side and 2+ on the left side.      Femoral pulses are 1+ on the right side with bruit and 1+ on the left side with bruit.      Popliteal pulses are 1+ on the right side and 0 on the left side.       Dorsalis pedis pulses are 0 on the right side and 0 on the left side.       Posterior tibial pulses are 0 on the right side and 0 on the left side.  Pulmonary/Chest: Effort normal and breath sounds normal.  Abdominal: Soft. Bowel sounds are normal.  Ventral hernia noted. Reducible  Musculoskeletal: Normal range of motion.  Neurological: He is alert.  Skin: Skin is warm and dry.  Psychiatric: He has a normal mood and affect.   Radiology: No results found.  Laboratory examination:   No recent labs available. Aic 03/03/18: 6.0%  PRN Meds:. Medications Discontinued During This Encounter  Medication Reason  . aspirin 81 MG tablet Error  . meloxicam (MOBIC) 15 MG tablet Error  . metFORMIN (GLUCOPHAGE) 1000 MG tablet Error  . omeprazole (PRILOSEC) 40 MG capsule Error   Current Meds  Medication Sig  . allopurinol (ZYLOPRIM) 100 MG tablet Take 100 mg by mouth 2 (two) times daily.  Marland Kitchen. amLODipine (NORVASC) 5 MG tablet   . aspirin EC 81 MG tablet Take 81 mg by mouth daily.  Marland Kitchen. atorvastatin (LIPITOR) 20 MG tablet   . cilostazol (PLETAL) 50 MG tablet Take 50 mg by mouth 2 (two) times daily.  . diclofenac sodium (VOLTAREN) 1 % GEL Apply 2 g topically 4 (four) times daily.  Marland Kitchen. gabapentin (NEURONTIN) 100 MG capsule   . glimepiride (AMARYL) 1 MG tablet Take 1 mg by mouth daily with breakfast.  . HYDROcodone-acetaminophen (NORCO/VICODIN) 5-325 MG tablet Take 1 tablet by mouth every 6 (six) hours as needed for moderate pain.  . indomethacin (INDOCIN) 50 MG capsule Take 50 mg by mouth daily.  Marland Kitchen.  losartan (COZAAR) 100 MG tablet Take 100 mg by mouth daily.  . pantoprazole (PROTONIX) 40 MG tablet Take 40 mg by mouth daily.  . traMADol (ULTRAM) 50 MG tablet every 6 (six) hours as needed.   . [DISCONTINUED] aspirin 81 MG tablet Take 325 mg by mouth daily.   . [DISCONTINUED] omeprazole (PRILOSEC) 40 MG capsule Take 40 mg by mouth daily.    Cardiac Studies:    Lower extremity arterial duplex 03/19/2017: No hemodynamically significant stenoses are identified in the right lower extremity arterial system.  Staccato resistant waveforms of the left profunda femoral artery. Biphasic waveforms  of the left anterior tibial artery. This exam reveals mildly decreased perfusion of the right lower extremity, and moderately decreased perfusion of the left lower extremity, noted at the post tibial artery level (ABI). RABI 0.94 and LABI 0.71. Study suggests small vessel disease. Clinical correlation recommended.  Abdominal aortic duplex 03/19/2017: The maximum aorta diameter is 2.76 cm (dist). Diffuse plaque observed in the mid and distal aorta. Normal flow velocities noted in the aorta and iliac arteries. No AAA noted.  Coronary angiogram 07/09/2007: Stenting of OM branch of circumflex with 3.0 x 15 mm Promus DES.  Left main ostial 20%, mid LAD 50% stenosis.  Diagonal 2, large has mid 90% stenosis.  Exercise sestamibi stress test 11/07/2014: 1. Resting EKG demonstrates normal sinus rhythm, stress EKG was negative for myocardial ischemia.  Occasional PVCs with stress EKG. The patient performed treadmill exercise using a Bruce protocol, completing 5:01 minutes. The patient completed an estimated workload of 7.03 METS, 100% of the maximum predicted heart rate. Stress symptoms included dyspnea. 2. The perfusion imaging study demonstrates a very small-sized apical inferolateral and a small sized apical-lateral ischemia of mild intensity.  Left ventricle systolic function calculated by QGS was mildly depressed at  49%. Overall the defect is small sized and mild in intensity, low risk stress test.  The defect probably correlates with the diagonal stenosis and may also represent in-stent restenosis in the circumflex coronary artery.  Clinical correlation is recommended.   Assessment   Coronary artery disease involving native coronary artery of native heart without angina pectoris  Claudication in peripheral vascular disease (HCC) - Plan: EKG 12-Lead  Essential hypertension  Hypercholesteremia  Hyperglycemia  EKG 08/11/2018: Normal sinus rhythm at the rate of 97 bpm, left atrial enlargement, rightward axis.  Incomplete right bundle branch block.  Low-voltage complexes.  Pulmonary disease pattern.   2 PVCs, multiform.  EKG 02/17/2017: Normal sinus rhythm at rate of 16 bpm, borderline criteria for left atrial enlargement, left axis deviation.  Poor R-wave progression, cannot exclude anteroseptal infarct old.  Normal QT interval.  No evidence of ischemia. No significant change from EKG 02/21/2016.  Recommendations:   Patient presents here for a annual visit and follow-up of coronary artery disease and peripheral arterial disease, he has reduced his physical activity significantly in fact is asking for a handicap sticker as he is not been able to walk much.  No significant change in mask examination from a year ago.  However he has markedly reduced pulses in bilateral lower extremity an abnormal lower extremity arterial duplex performed almost 2-3 years ago.  Symptoms had stabilized on Pletal but now symptoms are getting worse.  I'll set him up for lower extremity arterial duplex.  With regard to coronary artery disease he has not had any angina pectoris however today's EKG demonstrates to PVCs, multifocal and also EKG suggestive of right ventricle is trying which was not noted previously.  I'll like to obtain an echocardiogram.  I may consider doing a stress test as well.  Otherwise blood pressure is well  controlled, he is prediabetic and A1c stable, lipids are being managed by his PCP.  I'd like to see him back after the echocardiogram and Dopplers for follow-up.  Yates Decamp, MD, [facility] 08/11/2018, 1:16 PM [facility] Pager: [phone] Office: [phone] If no answer Cell [phone]

## 2018-08-12 ENCOUNTER — Other Ambulatory Visit: Payer: Self-pay

## 2018-09-05 ENCOUNTER — Other Ambulatory Visit: Payer: Self-pay | Admitting: Family Medicine

## 2018-09-05 DIAGNOSIS — Z20822 Contact with and (suspected) exposure to covid-19: Secondary | ICD-10-CM

## 2018-09-07 LAB — NOVEL CORONAVIRUS, NAA: SARS-CoV-2, NAA: NOT DETECTED

## 2018-09-08 ENCOUNTER — Other Ambulatory Visit: Payer: Self-pay

## 2018-09-08 MED ORDER — CILOSTAZOL 50 MG PO TABS: 50.0000 mg | ORAL_TABLET | Freq: Two times a day (BID) | ORAL | 1 refills | Status: DC

## 2018-09-09 ENCOUNTER — Telehealth: Payer: Self-pay | Admitting: Family Medicine

## 2018-09-09 NOTE — Telephone Encounter (Signed)
Pt returned call for COVID19 results - advised pt not detected/negative

## 2018-09-14 ENCOUNTER — Ambulatory Visit (INDEPENDENT_AMBULATORY_CARE_PROVIDER_SITE_OTHER): Payer: Medicare Other

## 2018-09-14 ENCOUNTER — Other Ambulatory Visit: Payer: Self-pay

## 2018-09-14 DIAGNOSIS — I2510 Atherosclerotic heart disease of native coronary artery without angina pectoris: Secondary | ICD-10-CM

## 2018-09-14 DIAGNOSIS — I739 Peripheral vascular disease, unspecified: Secondary | ICD-10-CM

## 2018-09-14 DIAGNOSIS — R9431 Abnormal electrocardiogram [ECG] [EKG]: Secondary | ICD-10-CM

## 2018-09-15 ENCOUNTER — Other Ambulatory Visit: Payer: Self-pay | Admitting: Cardiology

## 2018-09-15 ENCOUNTER — Other Ambulatory Visit: Payer: Self-pay

## 2018-09-15 MED ORDER — CILOSTAZOL 50 MG PO TABS: 50.0000 mg | ORAL_TABLET | Freq: Two times a day (BID) | ORAL | 1 refills | Status: DC

## 2018-09-15 NOTE — Progress Notes (Signed)
S/w pt gave him echo results.

## 2018-09-16 NOTE — Progress Notes (Signed)
S/w patient gave him results of lower extremity duplex

## 2018-10-13 ENCOUNTER — Encounter: Payer: Self-pay | Admitting: Cardiology

## 2018-10-14 ENCOUNTER — Other Ambulatory Visit: Payer: Self-pay

## 2018-10-14 ENCOUNTER — Ambulatory Visit (INDEPENDENT_AMBULATORY_CARE_PROVIDER_SITE_OTHER): Payer: Medicare Other | Admitting: Cardiology

## 2018-10-14 ENCOUNTER — Encounter: Payer: Self-pay | Admitting: Cardiology

## 2018-10-14 VITALS — BP 140/76 | HR 78 | Ht 72.0 in | Wt 199.1 lb

## 2018-10-14 DIAGNOSIS — E7800 Pure hypercholesterolemia, unspecified: Secondary | ICD-10-CM

## 2018-10-14 DIAGNOSIS — I10 Essential (primary) hypertension: Secondary | ICD-10-CM

## 2018-10-14 DIAGNOSIS — R739 Hyperglycemia, unspecified: Secondary | ICD-10-CM

## 2018-10-14 DIAGNOSIS — I493 Ventricular premature depolarization: Secondary | ICD-10-CM | POA: Diagnosis not present

## 2018-10-14 DIAGNOSIS — I2510 Atherosclerotic heart disease of native coronary artery without angina pectoris: Secondary | ICD-10-CM | POA: Diagnosis not present

## 2018-10-14 DIAGNOSIS — I739 Peripheral vascular disease, unspecified: Secondary | ICD-10-CM | POA: Diagnosis not present

## 2018-10-14 MED ORDER — AMLODIPINE BESYLATE 10 MG PO TABS: 10.0000 mg | ORAL_TABLET | Freq: Every day | ORAL | 3 refills | Status: DC

## 2018-10-14 NOTE — H&P (View-Only) (Signed)
Primary Physician/Referring:  Wenda Low, MD  Patient ID: Jeremy Keller, male    DOB: 1940-07-27, 78 y.o.   MRN: [id number]  Chief Complaint  Patient presents with  . Claudication  . Abnormal ECG    HPI: Jeremy Keller  is a 78 y.o. male  with coronary artery disease and angioplasty and stenting to his circumflex coronary artery in 2009. Past medical history significant for prior tobacco use disorder, he quit smoking in 2007, probably 10-pack-year history, hypertension, hyperlipidemia and hyperglycemia.  He now presents for annual visit.  Except for marked decrease in exercise capacity and physical activity due to severe claudication involving his left calf, states that he would like to have a handicap plycard.   He has had a low risk stress test in 2016 revealing apical lateral and inferolateral ischemia in the distribution of circumflex and probably diagonal branch of the LAD. Since being on aggressive medical therapy he has not had significant angina. No dyspnea, PND or orthopnea. No new symptoms since last OV.  Past Medical History:  Diagnosis Date  . Coronary artery disease   . Diabetes mellitus without complication ([place])   . Gout   . Myocardial infarct [facility])     Past Surgical History:  Procedure Laterality Date  . CARDIAC CATHETERIZATION      Social History   Socioeconomic History  . Marital status: Divorced    Spouse name: Not on file  . Number of children: 14  . Years of education: Not on file  . Highest education level: Not on file  Occupational History  . Not on file  Social Needs  . Financial resource strain: Not on file  . Food insecurity    Worry: Not on file    Inability: Not on file  . Transportation needs    Medical: Not on file    Non-medical: Not on file  Tobacco Use  . Smoking status: Former Smoker    Packs/day: 1.00    Types: Cigarettes    Quit date: 2007    Years since quitting: 13.5  . Smokeless tobacco: Never Used  Substance and Sexual  Activity  . Alcohol use: Yes    Comment: occ  . Drug use: No  . Sexual activity: Not on file  Lifestyle  . Physical activity    Days per week: Not on file    Minutes per session: Not on file  . Stress: Not on file  Relationships  . Social Herbalist on phone: Not on file    Gets together: Not on file    Attends religious service: Not on file    Active member of club or organization: Not on file    Attends meetings of clubs or organizations: Not on file    Relationship status: Not on file  . Intimate partner violence    Fear of current or ex partner: Not on file    Emotionally abused: Not on file    Physically abused: Not on file    Forced sexual activity: Not on file  Other Topics Concern  . Not on file  Social History Narrative  . Not on file    Review of Systems  Constitution: Negative for chills, decreased appetite, malaise/fatigue and weight gain.  Cardiovascular: Positive for claudication (left calf). Negative for dyspnea on exertion, leg swelling, palpitations and syncope.  Endocrine: Negative for cold intolerance.  Hematologic/Lymphatic: Does not bruise/bleed easily.  Musculoskeletal: Positive for joint pain (bilateral thumbs) and muscle  cramps (night cramps bilateral, left worse). Negative for joint swelling.  Gastrointestinal: Negative for abdominal pain, anorexia and change in bowel habit.  Genitourinary: Positive for decreased libido (ED).  Neurological: Negative for headaches and light-headedness.  Psychiatric/Behavioral: Negative for depression and substance abuse.  All other systems reviewed and are negative.     Objective  Blood pressure 140/76, pulse 78, height 6' (1.829 m), weight 199 lb 1.6 oz (90.3 kg), SpO2 94 %. Body mass index is 27 kg/m.    Physical Exam  Constitutional: He appears well-developed and well-nourished. No distress.  HENT:  Head: Atraumatic.  Eyes: Conjunctivae are normal.  Neck: Neck supple. No JVD present. No  thyromegaly present.  Cardiovascular: Normal rate, regular rhythm and normal heart sounds. Exam reveals decreased pulses. Exam reveals no gallop.  No murmur heard. Pulses:      Carotid pulses are 2+ on the right side and 2+ on the left side.      Femoral pulses are 1+ on the right side with bruit and 1+ on the left side with bruit.      Popliteal pulses are 1+ on the right side and 0 on the left side.       Dorsalis pedis pulses are 0 on the right side and 0 on the left side.       Posterior tibial pulses are 0 on the right side and 0 on the left side.  Pulmonary/Chest: Effort normal and breath sounds normal.  Abdominal: Soft. Bowel sounds are normal.  Ventral hernia noted. Reducible  Musculoskeletal: Normal range of motion.  Neurological: He is alert.  Skin: Skin is warm and dry.  Psychiatric: He has a normal mood and affect.   Radiology: No results found.  Laboratory examination:   No recent labs available. Aic 03/03/18: 6.0%  PRN Meds:. Medications Discontinued During This Encounter  Medication Reason  . amLODipine (NORVASC) 5 MG tablet Reorder   Current Meds  Medication Sig  . allopurinol (ZYLOPRIM) 100 MG tablet Take 100 mg by mouth 2 (two) times daily.  Marland Kitchen amLODipine (NORVASC) 10 MG tablet Take 1 tablet (10 mg total) by mouth daily.  Marland Kitchen aspirin EC 81 MG tablet Take 81 mg by mouth daily.  Marland Kitchen atorvastatin (LIPITOR) 20 MG tablet   . cilostazol (PLETAL) 50 MG tablet Take 1 tablet (50 mg total) by mouth 2 (two) times daily.  . diclofenac sodium (VOLTAREN) 1 % GEL Apply 2 g topically 4 (four) times daily.  Marland Kitchen gabapentin (NEURONTIN) 100 MG capsule   . glimepiride (AMARYL) 1 MG tablet Take 1 mg by mouth daily with breakfast.  . HYDROcodone-acetaminophen (NORCO/VICODIN) 5-325 MG tablet Take 1 tablet by mouth every 6 (six) hours as needed for moderate pain.  . indomethacin (INDOCIN) 50 MG capsule Take 50 mg by mouth daily.  Marland Kitchen losartan (COZAAR) 100 MG tablet Take 100 mg by mouth daily.   . pantoprazole (PROTONIX) 40 MG tablet Take 40 mg by mouth daily.  . traMADol (ULTRAM) 50 MG tablet every 6 (six) hours as needed.   . [DISCONTINUED] amLODipine (NORVASC) 5 MG tablet     Cardiac Studies:   Abdominal aortic duplex 03/19/2017: The maximum aorta diameter is 2.76 cm (dist). Diffuse plaque observed in the mid and distal aorta. Normal flow velocities noted in the aorta and iliac arteries. No AAA noted.  Coronary angiogram 07/09/2007: Stenting of OM branch of circumflex with 3.0 x 15 mm Promus DES.  Left main ostial 20%, mid LAD 50% stenosis.  Diagonal 2,  large has mid 90% stenosis.  Exercise sestamibi stress test 11/07/2014: 1. Resting EKG demonstrates normal sinus rhythm, stress EKG was negative for myocardial ischemia.  Occasional PVCs with stress EKG. The patient performed treadmill exercise using a Bruce protocol, completing 5:01 minutes. The patient completed an estimated workload of 7.03 METS, 100% of the maximum predicted heart rate. Stress symptoms included dyspnea. 2. The perfusion imaging study demonstrates a very small-sized apical inferolateral and a small sized apical-lateral ischemia of mild intensity.  Left ventricle systolic function calculated by QGS was mildly depressed at 49%. Overall the defect is small sized and mild in intensity, low risk stress test.  The defect probably correlates with the diagonal stenosis and may also represent in-stent restenosis in the circumflex coronary artery.  Clinical correlation is recommended.  Lower Extremity Arterial Duplex 09/14/2018: Moderate velocity increase at the right distal superficial femoral artery consistent with >50% stenosis. No hemodynamically significant stenoses are identified in the left lower extremity arterial system. Diffuse disease bilateral lower extremities with biphasic waveform. This exam reveals moderately decreased perfusion of the lower extremity, noted at the post tibial artery level (ABI). Bilateral ABI  0.75 with mildly abnormal biphasic waveform at the ankles. Compared to 03/19/17, right SFA stenosis is new and ABI decreased from 0.94.  Echocardiogram 09/14/2018: Normal LV systolic function with EF 55%. Left ventricle cavity is normal in size. Mild concentric hypertrophy of the left ventricle. Normal global wall motion. Grade I diastolic dysfunction. Calculated EF 55%. Septal motion is paradoxical. Frequent PVCs noted during the test.  Left atrial cavity is mildly dilated. Aneurysmal interatrial septum without 2D or color Doppler evidence of interatrial shunt. Mild (Grade I) mitral regurgitation. Normal right atrial pressure.  Compared to previous study in 2016, PVC new.    Assessment     ICD-10-CM   1. Claudication in peripheral vascular disease (HCC)  I73.9   2. Coronary artery disease involving native coronary artery of native heart without angina pectoris  I25.10 CBC    PCV MYOCARDIAL PERFUSION WITH LEXISCAN  3. PVC (premature ventricular contraction)  I49.3 PCV MYOCARDIAL PERFUSION WITH LEXISCAN  4. Essential hypertension  I10 amLODipine (NORVASC) 10 MG tablet    CMP14+EGFR  5. Hyperglycemia  R73.9 TSH    Hgb A1c w/o eAG  6. Hypercholesteremia  E78.00 Lipid Panel With LDL/HDL Ratio   EKG 08/11/2018: Normal sinus rhythm at the rate of 97 bpm, left atrial enlargement, rightward axis.  Incomplete right bundle branch block.  Low-voltage complexes.  Pulmonary disease pattern.   2 PVCs, multiform.  EKG 02/17/2017: Normal sinus rhythm at rate of 76 bpm, borderline criteria for left atrial enlargement, left axis deviation.  Poor R-wave progression, cannot exclude anteroseptal infarct old.  Normal QT interval.  No evidence of ischemia. No significant change from EKG 02/21/2016.  Recommendations:   Patient presents here for f/u evaluation  coronary artery disease and peripheral arterial disease, he has reduced his physical activity significantly in fact is asking for a handicap sticker as  he is not been able to walk much.   I reviewed the results of the recently performed lower except he arterial duplex, he clearly has reduced ABI bilaterally.  His symptoms of claudication are worse on the left than the right although this appears he has right SFA high-grade stenosis.  We will target his left leg 1st and then the right leg.  With regard to dyspnea, he is LVEF appears to be normal, he does have Diastolic dysfunction.  He had frequent PVCs  during echo examination. He also had previously abnormal stress test but.  Done medical therapy for the same, in view of frequent PVCs and previously abnormal stress test, it is been greater than 40 years, we'll repeat a Lexiscan Myoview stress test.  Blood pressure is elevated today, increased amlodipine from 5 mg to 10 mg. I have taken the liberty of ordering routine labs including lipid profile testing and I'll send a carbon copy to his PCP.   Patient willing to participate in "[facility]" for Lp(a) evaluation.  Adrian Prows, MD, [facility] 10/14/2018, 4:09 PM [facility] [place] Pager: [phone] Office: [phone] If no answer Cell [phone]

## 2018-10-14 NOTE — Progress Notes (Addendum)
Primary Physician/Referring:  Wenda Low, MD  Patient ID: Jeremy Keller, male    DOB: 1940-07-27, 78 y.o.   MRN: [id number]  Chief Complaint  Patient presents with  . Claudication  . Abnormal ECG    HPI: Jeremy Keller  is a 78 y.o. male  with coronary artery disease and angioplasty and stenting to his circumflex coronary artery in 2009. Past medical history significant for prior tobacco use disorder, he quit smoking in 2007, probably 10-pack-year history, hypertension, hyperlipidemia and hyperglycemia.  He now presents for annual visit.  Except for marked decrease in exercise capacity and physical activity due to severe claudication involving his left calf, states that he would like to have a handicap plycard.   He has had a low risk stress test in 2016 revealing apical lateral and inferolateral ischemia in the distribution of circumflex and probably diagonal branch of the LAD. Since being on aggressive medical therapy he has not had significant angina. No dyspnea, PND or orthopnea. No new symptoms since last OV.  Past Medical History:  Diagnosis Date  . Coronary artery disease   . Diabetes mellitus without complication ([place])   . Gout   . Myocardial infarct [facility])     Past Surgical History:  Procedure Laterality Date  . CARDIAC CATHETERIZATION      Social History   Socioeconomic History  . Marital status: Divorced    Spouse name: Not on file  . Number of children: 14  . Years of education: Not on file  . Highest education level: Not on file  Occupational History  . Not on file  Social Needs  . Financial resource strain: Not on file  . Food insecurity    Worry: Not on file    Inability: Not on file  . Transportation needs    Medical: Not on file    Non-medical: Not on file  Tobacco Use  . Smoking status: Former Smoker    Packs/day: 1.00    Types: Cigarettes    Quit date: 2007    Years since quitting: 13.5  . Smokeless tobacco: Never Used  Substance and Sexual  Activity  . Alcohol use: Yes    Comment: occ  . Drug use: No  . Sexual activity: Not on file  Lifestyle  . Physical activity    Days per week: Not on file    Minutes per session: Not on file  . Stress: Not on file  Relationships  . Social Herbalist on phone: Not on file    Gets together: Not on file    Attends religious service: Not on file    Active member of club or organization: Not on file    Attends meetings of clubs or organizations: Not on file    Relationship status: Not on file  . Intimate partner violence    Fear of current or ex partner: Not on file    Emotionally abused: Not on file    Physically abused: Not on file    Forced sexual activity: Not on file  Other Topics Concern  . Not on file  Social History Narrative  . Not on file    Review of Systems  Constitution: Negative for chills, decreased appetite, malaise/fatigue and weight gain.  Cardiovascular: Positive for claudication (left calf). Negative for dyspnea on exertion, leg swelling, palpitations and syncope.  Endocrine: Negative for cold intolerance.  Hematologic/Lymphatic: Does not bruise/bleed easily.  Musculoskeletal: Positive for joint pain (bilateral thumbs) and muscle  cramps (night cramps bilateral, left worse). Negative for joint swelling.  Gastrointestinal: Negative for abdominal pain, anorexia and change in bowel habit.  Genitourinary: Positive for decreased libido (ED).  Neurological: Negative for headaches and light-headedness.  Psychiatric/Behavioral: Negative for depression and substance abuse.  All other systems reviewed and are negative.     Objective  Blood pressure 140/76, pulse 78, height 6' (1.829 m), weight 199 lb 1.6 oz (90.3 kg), SpO2 94 %. Body mass index is 27 kg/m.    Physical Exam  Constitutional: He appears well-developed and well-nourished. No distress.  HENT:  Head: Atraumatic.  Eyes: Conjunctivae are normal.  Neck: Neck supple. No JVD present. No  thyromegaly present.  Cardiovascular: Normal rate, regular rhythm and normal heart sounds. Exam reveals decreased pulses. Exam reveals no gallop.  No murmur heard. Pulses:      Carotid pulses are 2+ on the right side and 2+ on the left side.      Femoral pulses are 1+ on the right side with bruit and 1+ on the left side with bruit.      Popliteal pulses are 1+ on the right side and 0 on the left side.       Dorsalis pedis pulses are 0 on the right side and 0 on the left side.       Posterior tibial pulses are 0 on the right side and 0 on the left side.  Pulmonary/Chest: Effort normal and breath sounds normal.  Abdominal: Soft. Bowel sounds are normal.  Ventral hernia noted. Reducible  Musculoskeletal: Normal range of motion.  Neurological: He is alert.  Skin: Skin is warm and dry.  Psychiatric: He has a normal mood and affect.   Radiology: No results found.  Laboratory examination:   No recent labs available. Aic 03/03/18: 6.0%  PRN Meds:. Medications Discontinued During This Encounter  Medication Reason  . amLODipine (NORVASC) 5 MG tablet Reorder   Current Meds  Medication Sig  . allopurinol (ZYLOPRIM) 100 MG tablet Take 100 mg by mouth 2 (two) times daily.  . amLODipine (NORVASC) 10 MG tablet Take 1 tablet (10 mg total) by mouth daily.  . aspirin EC 81 MG tablet Take 81 mg by mouth daily.  . atorvastatin (LIPITOR) 20 MG tablet   . cilostazol (PLETAL) 50 MG tablet Take 1 tablet (50 mg total) by mouth 2 (two) times daily.  . diclofenac sodium (VOLTAREN) 1 % GEL Apply 2 g topically 4 (four) times daily.  . gabapentin (NEURONTIN) 100 MG capsule   . glimepiride (AMARYL) 1 MG tablet Take 1 mg by mouth daily with breakfast.  . HYDROcodone-acetaminophen (NORCO/VICODIN) 5-325 MG tablet Take 1 tablet by mouth every 6 (six) hours as needed for moderate pain.  . indomethacin (INDOCIN) 50 MG capsule Take 50 mg by mouth daily.  . losartan (COZAAR) 100 MG tablet Take 100 mg by mouth daily.   . pantoprazole (PROTONIX) 40 MG tablet Take 40 mg by mouth daily.  . traMADol (ULTRAM) 50 MG tablet every 6 (six) hours as needed.   . [DISCONTINUED] amLODipine (NORVASC) 5 MG tablet     Cardiac Studies:   Abdominal aortic duplex 03/19/2017: The maximum aorta diameter is 2.76 cm (dist). Diffuse plaque observed in the mid and distal aorta. Normal flow velocities noted in the aorta and iliac arteries. No AAA noted.  Coronary angiogram 07/09/2007: Stenting of OM branch of circumflex with 3.0 x 15 mm Promus DES.  Left main ostial 20%, mid LAD 50% stenosis.  Diagonal 2,   large has mid 90% stenosis.  Exercise sestamibi stress test 11/07/2014: 1. Resting EKG demonstrates normal sinus rhythm, stress EKG was negative for myocardial ischemia.  Occasional PVCs with stress EKG. The patient performed treadmill exercise using a Bruce protocol, completing 5:01 minutes. The patient completed an estimated workload of 7.03 METS, 100% of the maximum predicted heart rate. Stress symptoms included dyspnea. 2. The perfusion imaging study demonstrates a very small-sized apical inferolateral and a small sized apical-lateral ischemia of mild intensity.  Left ventricle systolic function calculated by QGS was mildly depressed at 49%. Overall the defect is small sized and mild in intensity, low risk stress test.  The defect probably correlates with the diagonal stenosis and may also represent in-stent restenosis in the circumflex coronary artery.  Clinical correlation is recommended.  Lower Extremity Arterial Duplex 09/14/2018: Moderate velocity increase at the right distal superficial femoral artery consistent with >50% stenosis. No hemodynamically significant stenoses are identified in the left lower extremity arterial system. Diffuse disease bilateral lower extremities with biphasic waveform. This exam reveals moderately decreased perfusion of the lower extremity, noted at the post tibial artery level (ABI). Bilateral ABI  0.75 with mildly abnormal biphasic waveform at the ankles. Compared to 03/19/17, right SFA stenosis is new and ABI decreased from 0.94.  Echocardiogram 09/14/2018: Normal LV systolic function with EF 55%. Left ventricle cavity is normal in size. Mild concentric hypertrophy of the left ventricle. Normal global wall motion. Grade I diastolic dysfunction. Calculated EF 55%. Septal motion is paradoxical. Frequent PVCs noted during the test.  Left atrial cavity is mildly dilated. Aneurysmal interatrial septum without 2D or color Doppler evidence of interatrial shunt. Mild (Grade I) mitral regurgitation. Normal right atrial pressure.  Compared to previous study in 2016, PVC new.    Assessment     ICD-10-CM   1. Claudication in peripheral vascular disease (HCC)  I73.9   2. Coronary artery disease involving native coronary artery of native heart without angina pectoris  I25.10 CBC    PCV MYOCARDIAL PERFUSION WITH LEXISCAN  3. PVC (premature ventricular contraction)  I49.3 PCV MYOCARDIAL PERFUSION WITH LEXISCAN  4. Essential hypertension  I10 amLODipine (NORVASC) 10 MG tablet    CMP14+EGFR  5. Hyperglycemia  R73.9 TSH    Hgb A1c w/o eAG  6. Hypercholesteremia  E78.00 Lipid Panel With LDL/HDL Ratio   EKG 08/11/2018: Normal sinus rhythm at the rate of 97 bpm, left atrial enlargement, rightward axis.  Incomplete right bundle branch block.  Low-voltage complexes.  Pulmonary disease pattern.   2 PVCs, multiform.  EKG 02/17/2017: Normal sinus rhythm at rate of 76 bpm, borderline criteria for left atrial enlargement, left axis deviation.  Poor R-wave progression, cannot exclude anteroseptal infarct old.  Normal QT interval.  No evidence of ischemia. No significant change from EKG 02/21/2016.  Recommendations:   Patient presents here for f/u evaluation  coronary artery disease and peripheral arterial disease, he has reduced his physical activity significantly in fact is asking for a handicap sticker as  he is not been able to walk much.   I reviewed the results of the recently performed lower except he arterial duplex, he clearly has reduced ABI bilaterally.  His symptoms of claudication are worse on the left than the right although this appears he has right SFA high-grade stenosis.  We will target his left leg 1st and then the right leg.  With regard to dyspnea, he is LVEF appears to be normal, he does have Diastolic dysfunction.  He had frequent PVCs   during echo examination. He also had previously abnormal stress test but.  Done medical therapy for the same, in view of frequent PVCs and previously abnormal stress test, it is been greater than 40 years, we'll repeat a Lexiscan Myoview stress test.  Blood pressure is elevated today, increased amlodipine from 5 mg to 10 mg. I have taken the liberty of ordering routine labs including lipid profile testing and I'll send a carbon copy to his PCP.   Patient willing to participate in "[facility]" for Lp(a) evaluation.  Adrian Prows, MD, [facility] 10/14/2018, 4:09 PM [facility]. [place] Pager: [phone] Office: [phone] If no answer Cell [phone]

## 2018-10-15 NOTE — Progress Notes (Signed)
Dont need to send it back, please hit done.

## 2018-10-17 LAB — CMP14+EGFR
ALT: 26 IU/L (ref 0–44)
AST: 28 IU/L (ref 0–40)
Albumin/Globulin Ratio: 1.6 (ref 1.2–2.2)
Albumin: 4.4 g/dL (ref 3.7–4.7)
Alkaline Phosphatase: 73 IU/L (ref 39–117)
BUN/Creatinine Ratio: 10 (ref 10–24)
BUN: 10 mg/dL (ref 8–27)
Bilirubin Total: 0.6 mg/dL (ref 0.0–1.2)
CO2: 24 mmol/L (ref 20–29)
Calcium: 9.9 mg/dL (ref 8.6–10.2)
Chloride: 103 mmol/L (ref 96–106)
Creatinine, Ser: 1.05 mg/dL (ref 0.76–1.27)
GFR calc Af Amer: 78 mL/min/{1.73_m2} (ref >59)
GFR calc non Af Amer: 68 mL/min/{1.73_m2} (ref >59)
Globulin, Total: 2.8 g/dL (ref 1.5–4.5)
Glucose: 106 mg/dL — ABNORMAL HIGH (ref 65–99)
Potassium: 4.6 mmol/L (ref 3.5–5.2)
Sodium: 143 mmol/L (ref 134–144)
Total Protein: 7.2 g/dL (ref 6.0–8.5)

## 2018-10-17 LAB — CBC
Hematocrit: 46 % (ref 37.5–51.0)
Hemoglobin: 15.8 g/dL (ref 13.0–17.7)
MCH: 31 pg (ref 26.6–33.0)
MCHC: 34.3 g/dL (ref 31.5–35.7)
MCV: 90 fL (ref 79–97)
Platelets: 232 10*3/uL (ref 150–450)
RBC: 5.1 x10E6/uL (ref 4.14–5.80)
RDW: 12.7 % (ref 11.6–15.4)
WBC: 8.2 10*3/uL (ref 3.4–10.8)

## 2018-10-17 LAB — LIPID PANEL WITH LDL/HDL RATIO
Cholesterol, Total: 128 mg/dL (ref 100–199)
HDL: 60 mg/dL (ref >39)
LDL Calculated: 51 mg/dL (ref 0–99)
LDl/HDL Ratio: 0.9 ratio (ref 0.0–3.6)
Triglycerides: 83 mg/dL (ref 0–149)
VLDL Cholesterol Cal: 17 mg/dL (ref 5–40)

## 2018-10-17 LAB — TSH: TSH: 1.57 u[IU]/mL (ref 0.450–4.500)

## 2018-10-17 LAB — HGB A1C W/O EAG: Hgb A1c MFr Bld: 6.3 % — ABNORMAL HIGH (ref 4.8–5.6)

## 2018-10-23 ENCOUNTER — Ambulatory Visit (INDEPENDENT_AMBULATORY_CARE_PROVIDER_SITE_OTHER): Payer: Medicare Other

## 2018-10-23 ENCOUNTER — Other Ambulatory Visit: Payer: Self-pay

## 2018-10-23 DIAGNOSIS — I2510 Atherosclerotic heart disease of native coronary artery without angina pectoris: Secondary | ICD-10-CM

## 2018-10-23 DIAGNOSIS — I493 Ventricular premature depolarization: Secondary | ICD-10-CM

## 2018-10-26 NOTE — Progress Notes (Signed)
Pt aware.

## 2018-10-28 ENCOUNTER — Encounter: Payer: Self-pay | Admitting: Orthopaedic Surgery

## 2018-10-28 ENCOUNTER — Ambulatory Visit: Payer: Self-pay

## 2018-10-28 ENCOUNTER — Ambulatory Visit (INDEPENDENT_AMBULATORY_CARE_PROVIDER_SITE_OTHER): Payer: Medicare Other | Admitting: Orthopaedic Surgery

## 2018-10-28 DIAGNOSIS — M25562 Pain in left knee: Secondary | ICD-10-CM | POA: Diagnosis not present

## 2018-10-28 MED ORDER — METHYLPREDNISOLONE ACETATE 40 MG/ML IJ SUSP
40.0000 mg | INTRAMUSCULAR | Status: AC | PRN
  Administered 2018-10-28: 09:00:00 40 mg via INTRA_ARTICULAR

## 2018-10-28 MED ORDER — LIDOCAINE HCL 1 % IJ SOLN
3.0000 mL | INTRAMUSCULAR | Status: AC | PRN
  Administered 2018-10-28: 3 mL

## 2018-10-28 NOTE — Progress Notes (Signed)
Office Visit Note   Patient: Jeremy Keller           Date of Birth: [birth year]           MRN: [id number] Visit Date: 10/28/2018              Requested by: Wenda Low, MD [place] [employer] [place] [address],  [place] [address] PCP: Wenda Low, MD   Assessment & Plan: Visit Diagnoses:  1. Acute pain of left knee     Plan: I do feel it is worth trying an aspiration and a steroid injection in the knee today.  I was able to aspirate the knee and got really no fluid out of it but with the lidocaine and Depo-Medrol this gave him significant relief from the pain he was having.  I do not feel this will definitely affect him having his procedure next week at all.  I would like to see him back myself in 2 weeks to see how this is done for him.  All question concerns were answered and addressed.  Follow-Up Instructions: Return in about 2 weeks (around 11/11/2018).   Orders:  Orders Placed This Encounter  Procedures  . Large Joint Inj  . XR Knee 1-2 Views Left   No orders of the defined types were placed in this encounter.     Procedures: Large Joint Inj: L knee on 10/28/2018 9:00 AM Indications: diagnostic evaluation and pain Details: 22 G 1.5 in needle, superolateral approach  Arthrogram: No  Medications: 3 mL lidocaine 1 %; 40 mg methylPREDNISolone acetate 40 MG/ML Outcome: tolerated well, no immediate complications Procedure, treatment alternatives, risks and benefits explained, specific risks discussed. Consent was given by the patient. Immediately prior to procedure a time out was called to verify the correct patient, procedure, equipment, support staff and site/side  marked as required. Patient was prepped and draped in the usual sterile fashion.       Clinical Data: No additional findings.   Subjective: Chief Complaint  Patient presents with  . Left Knee - Pain  The patient comes in today with a 5 to 6-day history of worsening left knee pain.  He denies any injuries.  He is never had surgery on that left knee.  He does have a history of poor circulation to his legs.  He actually sees Dr. Einar Gip who is performing some type of intervention on his lower extremities next week to potentially improve the blood flow.  He is not a diabetic.  He reports that his knee has been painful with walking and raising  his leg and is having some swelling as well.  HPI  Review of Systems He currently denies any headache, chest pain, shortness of breath, fever, chills, nausea, vomiting  Objective: Vital Signs: There were no vitals taken for this visit.  Physical Exam He is alert and orient x3 and in no acute distress Ortho Exam Examination of his left knee shows no significant effusion.  He has full extension but when I start flexing and past 90 degrees he has significant out of pain around the knee itself.  There is no fullness in the popliteal area of the knee and his calf is soft. Specialty Comments:  No specialty comments available.  Imaging: Xr Knee 1-2 Views Left  Result Date: 10/28/2018 2 views left knee show no acute findings.  There is no effusion.  There is moderate arthritis with joint space narrowing and para-articular osteophytes but not bone-on-bone wear.    PMFS History: Patient Active Problem List   Diagnosis Date Noted  . CAD 07/09/07: Cx-OM 3x15 Promus, Mid LAD50%, Large D2 90% mid. 08/11/2018  . Essential hypertension 08/11/2018  . Hypercholesteremia 08/11/2018  . Hyperglycemia 08/11/2018  . Claudication in peripheral vascular disease (HCC) 08/11/2018  . Pain in left hip 07/09/2017  . Trochanteric bursitis, left hip 04/10/2016  . Gout     Past Medical History:  Diagnosis Date  . Coronary artery disease   . Diabetes mellitus without complication (HCC)   . Gout   . Myocardial infarct [facility](HCC)     No family history on file.  Past Surgical History:  Procedure Laterality Date  . CARDIAC CATHETERIZATION     Social History   Occupational History  . Not on file  Tobacco Use  . Smoking status: Former Smoker    Packs/day: 1.00    Types: Cigarettes    Quit date: 2007    Years since quitting: 13.5  . Smokeless tobacco: Never Used  Substance and Sexual Activity  . Alcohol use: Yes    Comment: occ  . Drug use: No  . Sexual activity: Not on file

## 2018-10-30 ENCOUNTER — Other Ambulatory Visit (HOSPITAL_COMMUNITY)
Admission: RE | Admit: 2018-10-30 | Discharge: 2018-10-30 | Disposition: A | Discharge status: Home / Self Care | Payer: Medicare Other | Source: Ambulatory Visit | Attending: Cardiology | Admitting: Cardiology

## 2018-10-30 DIAGNOSIS — Z20828 Contact with and (suspected) exposure to other viral communicable diseases: Secondary | ICD-10-CM | POA: Diagnosis not present

## 2018-10-30 DIAGNOSIS — Z01812 Encounter for preprocedural laboratory examination: Secondary | ICD-10-CM | POA: Diagnosis present

## 2018-10-30 LAB — SARS CORONAVIRUS 2 (TAT 6-24 HRS): SARS Coronavirus 2: NEGATIVE

## 2018-11-03 ENCOUNTER — Encounter (HOSPITAL_COMMUNITY)
Admission: RE | Disposition: A | Discharge status: Home / Self Care | Payer: Self-pay | Source: Home / Self Care | Attending: Cardiology

## 2018-11-03 ENCOUNTER — Ambulatory Visit (HOSPITAL_COMMUNITY)
Admission: RE | Admit: 2018-11-03 | Discharge: 2018-11-03 | Disposition: A | Discharge status: Home / Self Care | Payer: Medicare Other | Attending: Cardiology | Admitting: Cardiology

## 2018-11-03 ENCOUNTER — Encounter (HOSPITAL_COMMUNITY): Payer: Self-pay | Admitting: Cardiology

## 2018-11-03 ENCOUNTER — Other Ambulatory Visit: Payer: Self-pay

## 2018-11-03 DIAGNOSIS — Z7984 Long term (current) use of oral hypoglycemic drugs: Secondary | ICD-10-CM | POA: Insufficient documentation

## 2018-11-03 DIAGNOSIS — I771 Stricture of artery: Secondary | ICD-10-CM | POA: Diagnosis not present

## 2018-11-03 DIAGNOSIS — I252 Old myocardial infarction: Secondary | ICD-10-CM | POA: Diagnosis not present

## 2018-11-03 DIAGNOSIS — I10 Essential (primary) hypertension: Secondary | ICD-10-CM

## 2018-11-03 DIAGNOSIS — I2510 Atherosclerotic heart disease of native coronary artery without angina pectoris: Secondary | ICD-10-CM | POA: Diagnosis not present

## 2018-11-03 DIAGNOSIS — Z87891 Personal history of nicotine dependence: Secondary | ICD-10-CM | POA: Insufficient documentation

## 2018-11-03 DIAGNOSIS — Z7982 Long term (current) use of aspirin: Secondary | ICD-10-CM | POA: Insufficient documentation

## 2018-11-03 DIAGNOSIS — E1165 Type 2 diabetes mellitus with hyperglycemia: Secondary | ICD-10-CM | POA: Insufficient documentation

## 2018-11-03 DIAGNOSIS — Z79899 Other long term (current) drug therapy: Secondary | ICD-10-CM | POA: Diagnosis not present

## 2018-11-03 DIAGNOSIS — M109 Gout, unspecified: Secondary | ICD-10-CM | POA: Diagnosis not present

## 2018-11-03 DIAGNOSIS — E1151 Type 2 diabetes mellitus with diabetic peripheral angiopathy without gangrene: Secondary | ICD-10-CM | POA: Diagnosis present

## 2018-11-03 DIAGNOSIS — I119 Hypertensive heart disease without heart failure: Secondary | ICD-10-CM | POA: Diagnosis not present

## 2018-11-03 DIAGNOSIS — I70213 Atherosclerosis of native arteries of extremities with intermittent claudication, bilateral legs: Secondary | ICD-10-CM

## 2018-11-03 DIAGNOSIS — I739 Peripheral vascular disease, unspecified: Secondary | ICD-10-CM | POA: Diagnosis present

## 2018-11-03 DIAGNOSIS — E7800 Pure hypercholesterolemia, unspecified: Secondary | ICD-10-CM | POA: Diagnosis not present

## 2018-11-03 HISTORY — PX: PERIPHERAL VASCULAR ATHERECTOMY: CATH118256

## 2018-11-03 HISTORY — PX: PERIPHERAL VASCULAR BALLOON ANGIOPLASTY: CATH118281

## 2018-11-03 HISTORY — PX: LOWER EXTREMITY ANGIOGRAPHY: CATH118251

## 2018-11-03 LAB — POCT ACTIVATED CLOTTING TIME
Activated Clotting Time: 219 seconds
Activated Clotting Time: 202 seconds

## 2018-11-03 LAB — GLUCOSE, CAPILLARY: Glucose-Capillary: 116 mg/dL — ABNORMAL HIGH (ref 70–99)

## 2018-11-03 SURGERY — LOWER EXTREMITY ANGIOGRAPHY
Anesthesia: LOCAL

## 2018-11-03 MED ORDER — SODIUM CHLORIDE 0.9 % WEIGHT BASED INFUSION: 1.0000 mL/kg/h | INTRAVENOUS | Status: DC

## 2018-11-03 MED ORDER — ATROPINE SULFATE 1 MG/10ML IJ SOSY
PREFILLED_SYRINGE | INTRAMUSCULAR | Status: DC | PRN
  Administered 2018-11-03: 0.5 mg via INTRAVENOUS

## 2018-11-03 MED ORDER — HEPARIN (PORCINE) IN NACL 1000-0.9 UT/500ML-% IV SOLN
INTRAVENOUS | Status: AC
  Filled 2018-11-03: qty 1000

## 2018-11-03 MED ORDER — SODIUM CHLORIDE 0.9 % IV SOLN: 250.0000 mL | INTRAVENOUS | Status: DC | PRN

## 2018-11-03 MED ORDER — IODIXANOL 320 MG/ML IV SOLN
INTRAVENOUS | Status: DC | PRN
  Administered 2018-11-03: 140 mL via INTRA_ARTERIAL

## 2018-11-03 MED ORDER — ACETAMINOPHEN 325 MG PO TABS: 650.0000 mg | ORAL_TABLET | ORAL | Status: DC | PRN

## 2018-11-03 MED ORDER — HEPARIN SODIUM (PORCINE) 1000 UNIT/ML IJ SOLN
INTRAMUSCULAR | Status: DC | PRN
  Administered 2018-11-03: 8000 [IU] via INTRAVENOUS
  Administered 2018-11-03: 3000 [IU] via INTRAVENOUS

## 2018-11-03 MED ORDER — NITROGLYCERIN 1 MG/10 ML FOR IR/CATH LAB
INTRA_ARTERIAL | Status: DC | PRN
  Administered 2018-11-03: 400 ug via INTRA_ARTERIAL

## 2018-11-03 MED ORDER — HEPARIN SODIUM (PORCINE) 1000 UNIT/ML IJ SOLN
INTRAMUSCULAR | Status: AC
  Filled 2018-11-03: qty 1

## 2018-11-03 MED ORDER — CLOPIDOGREL BISULFATE 75 MG PO TABS: 75.0000 mg | ORAL_TABLET | Freq: Every day | ORAL | 2 refills | Status: DC

## 2018-11-03 MED ORDER — SODIUM CHLORIDE 0.9% FLUSH: 3.0000 mL | Freq: Two times a day (BID) | INTRAVENOUS | Status: DC

## 2018-11-03 MED ORDER — SODIUM CHLORIDE 0.9 % IV BOLUS
500.0000 mL | Freq: Once | INTRAVENOUS | Status: AC
  Administered 2018-11-03: 1000 mL via INTRAVENOUS

## 2018-11-03 MED ORDER — MIDAZOLAM HCL 2 MG/2ML IJ SOLN
INTRAMUSCULAR | Status: DC | PRN
  Administered 2018-11-03: 1 mg via INTRAVENOUS

## 2018-11-03 MED ORDER — FENTANYL CITRATE (PF) 100 MCG/2ML IJ SOLN
INTRAMUSCULAR | Status: DC | PRN
  Administered 2018-11-03: 50 ug via INTRAVENOUS

## 2018-11-03 MED ORDER — SODIUM CHLORIDE 0.9 % IV SOLN
INTRAVENOUS | Status: AC | PRN
  Administered 2018-11-03: 10 mL/h via INTRAVENOUS

## 2018-11-03 MED ORDER — MIDAZOLAM HCL 2 MG/2ML IJ SOLN
INTRAMUSCULAR | Status: AC
  Filled 2018-11-03: qty 2

## 2018-11-03 MED ORDER — ONDANSETRON HCL 4 MG/2ML IJ SOLN: 4.0000 mg | Freq: Four times a day (QID) | INTRAMUSCULAR | Status: DC | PRN

## 2018-11-03 MED ORDER — SODIUM CHLORIDE 0.9% FLUSH: 3.0000 mL | INTRAVENOUS | Status: DC | PRN

## 2018-11-03 MED ORDER — LIDOCAINE HCL (PF) 1 % IJ SOLN
INTRAMUSCULAR | Status: DC | PRN
  Administered 2018-11-03: 15 mL

## 2018-11-03 MED ORDER — FENTANYL CITRATE (PF) 100 MCG/2ML IJ SOLN
INTRAMUSCULAR | Status: AC
  Filled 2018-11-03: qty 2

## 2018-11-03 MED ORDER — ATROPINE SULFATE 1 MG/10ML IJ SOSY
PREFILLED_SYRINGE | INTRAMUSCULAR | Status: AC
  Filled 2018-11-03: qty 10

## 2018-11-03 MED ORDER — NITROGLYCERIN 1 MG/10 ML FOR IR/CATH LAB
INTRA_ARTERIAL | Status: AC
  Filled 2018-11-03: qty 10

## 2018-11-03 MED ORDER — HEPARIN (PORCINE) IN NACL 1000-0.9 UT/500ML-% IV SOLN
INTRAVENOUS | Status: DC | PRN
  Administered 2018-11-03 (×2): 500 mL

## 2018-11-03 MED ORDER — AMLODIPINE BESYLATE 10 MG PO TABS: 5.0000 mg | ORAL_TABLET | Freq: Every day | ORAL | 3 refills | Status: DC

## 2018-11-03 MED ORDER — LIDOCAINE HCL (PF) 1 % IJ SOLN
INTRAMUSCULAR | Status: AC
  Filled 2018-11-03: qty 30

## 2018-11-03 MED ORDER — SODIUM CHLORIDE 0.9 % IV SOLN: INTRAVENOUS | Status: DC

## 2018-11-03 SURGICAL SUPPLY — 24 items
BALLN IN.PACT DCB 6X60 (BALLOONS) ×3
CATH HAWKONE LS STANDARD TIP (CATHETERS) ×3
CATH HAWKONE LS STD TIP (CATHETERS) IMPLANT
CATH OMNI FLUSH 5F 65CM (CATHETERS) ×1 IMPLANT
CATH SOFT-VU ST 4F 90CM (CATHETERS) ×1 IMPLANT
CLOSURE MYNX CONTROL 6F/7F (Vascular Products) ×1 IMPLANT
DCB IN.PACT 6X60 (BALLOONS) IMPLANT
DEVICE SPIDERFX EMB PROT 6MM (WIRE) ×1 IMPLANT
KIT ENCORE 26 ADVANTAGE (KITS) ×1 IMPLANT
KIT MICROPUNCTURE NIT STIFF (SHEATH) ×1 IMPLANT
KIT PV (KITS) ×3 IMPLANT
PAD PRO RADIOLUCENT 2001M-C (PAD) ×1 IMPLANT
SHEATH HIGHFLEX ANSEL 7FR 55CM (SHEATH) ×1 IMPLANT
SHEATH PINNACLE 5F 10CM (SHEATH) ×1 IMPLANT
SHEATH PINNACLE 7F 10CM (SHEATH) ×1 IMPLANT
SHEATH PROBE COVER 6X72 (BAG) ×1 IMPLANT
STOPCOCK MORSE 400PSI 3WAY (MISCELLANEOUS) ×1 IMPLANT
SYR MEDRAD MARK 7 150ML (SYRINGE) ×3 IMPLANT
TAPE VIPERTRACK RADIOPAQ (MISCELLANEOUS) IMPLANT
TAPE VIPERTRACK RADIOPAQUE (MISCELLANEOUS) ×3
TRANSDUCER W/STOPCOCK (MISCELLANEOUS) ×3 IMPLANT
TRAY PV CATH (CUSTOM PROCEDURE TRAY) ×3 IMPLANT
TUBING CIL FLEX 10 FLL-RA (TUBING) ×1 IMPLANT
WIRE BENTSON .035X145CM (WIRE) ×1 IMPLANT

## 2018-11-03 NOTE — Interval H&P Note (Signed)
History and Physical Interval Note:  11/03/2018 7:40 AM  Jeremy Keller  has presented today for surgery, with the diagnosis of claudication.  The various methods of treatment have been discussed with the patient and family. After consideration of risks, benefits and other options for treatment, the patient has consented to  Procedure(s): LOWER EXTREMITY ANGIOGRAPHY (N/A) and possible intervention as a surgical intervention.  The patient's history has been reviewed, patient examined, no change in status, stable for surgery.  I have reviewed the patient's chart and labs.  Questions were answered to the patient's satisfaction.     Adrian Prows

## 2018-11-03 NOTE — Discharge Instructions (Signed)
° °  DRINK PLENTY OF FLUIDS FOR THE NEXT 2-3 DAYS TO KEEP HYDRATED. ° °Femoral Site Care °This sheet gives you information about how to care for yourself after your procedure. Your health care provider may also give you more specific instructions. If you have problems or questions, contact your health care provider. °What can I expect after the procedure? °After the procedure, it is common to have: °· Bruising that usually fades within 1-2 weeks. °· Tenderness at the site. °Follow these instructions at home: °Wound care °· Follow instructions from your health care provider about how to take care of your insertion site. Make sure you: °? Wash your hands with soap and water before you change your bandage (dressing). If soap and water are not available, use hand sanitizer. °? Change your dressing as told by your health care provider. °· Do not take baths, swim, or use a hot tubfor 5 days. °· You may shower 24-48 hours after the procedure. °? Gently wash the site with plain soap and water. °? Pat the area dry with a clean towel. °? Do not rub the site. This may cause bleeding. °· Do not apply powder or lotion to the site. Keep the site clean and dry. °· Check your femoral site every day for signs of infection. Check for: °? Redness, swelling, or pain. °? Fluid or blood. °? Warmth. °? Pus or a bad smell. °Activity °· For the first 2-3 days after your procedure, or as long as directed: °? Avoid climbing stairs as much as possible. °? Do not squat. °· Do not lift anything that is heavier than 10 lb for 5 days. °· Rest as directed. °? Avoid sitting for a long time without moving. Get up to take short walks every 1-2 hours. °· Do not drive for 24 hours if you were given a medicine to help you relax (sedative). °General instructions °· Take over-the-counter and prescription medicines only as told by your health care provider. °· Keep all follow-up visits as told by your health care provider. This is important. °Contact a health  care provider if you have: °· A fever or chills. °· You have redness, swelling, or pain around your insertion site. °Get help right away if: °· The catheter insertion area swells very fast. °· You pass out. °· You suddenly start to sweat or your skin gets clammy. °· The catheter insertion area is bleeding, and the bleeding does not stop when you hold steady pressure on the area. °· The area near or just beyond the catheter insertion site becomes pale, cool, tingly, or numb. °These symptoms may represent a serious problem that is an emergency. Do not wait to see if the symptoms will go away. Get medical help right away. Call your local emergency services (911 in the [place] Do not drive yourself to the hospital. °Summary °· After the procedure, it is common to have bruising that usually fades within 1-2 weeks. °· Check your femoral site every day for signs of infection. °· Do not lift anything that is heavier than 10 lb for 5 days. ° ° °This information is not intended to replace advice given to you by your health care provider. Make sure you discuss any questions you have with your health care provider. °Document Released: 11/19/2013 Document Revised: 03/31/2017 Document Reviewed: 03/31/2017 °Elsevier Patient Education © 2020 [employer] Inc. ° °

## 2018-11-10 ENCOUNTER — Other Ambulatory Visit: Payer: Self-pay | Admitting: Cardiology

## 2018-11-11 ENCOUNTER — Ambulatory Visit (INDEPENDENT_AMBULATORY_CARE_PROVIDER_SITE_OTHER): Payer: Medicare Other | Admitting: Orthopaedic Surgery

## 2018-11-11 ENCOUNTER — Telehealth: Payer: Self-pay

## 2018-11-11 ENCOUNTER — Encounter: Payer: Self-pay | Admitting: Orthopaedic Surgery

## 2018-11-11 DIAGNOSIS — M1712 Unilateral primary osteoarthritis, left knee: Secondary | ICD-10-CM | POA: Diagnosis not present

## 2018-11-11 NOTE — Progress Notes (Signed)
HPI: Mr. Hassell Done returns now 2 weeks status post left knee injection.  He states the injection helped.  He states he was doing better prior to his procedure with Dr. Einar Gip for left leg claudication.  States knee is still better than it was before the injection.  Patient is asking for knee brace feels his help with his knee pain.  He denies any mechanical symptoms of the knee.  Physical exam: Left knee has full extension full flexion.  No tenderness with palpation along medial lateral joint line.  No instability valgus varus stressing.  No effusion abnormal warmth erythema.  Impression: Left knee osteoarthritis  Plan: When he is cleared by Dr. Renaee Munda he can work on quad strengthening.  He is given a hinged knee brace today.  He will follow-up with [place] for supplemental injection in the knee handout was given discussed supplemental injections with the patient at length.  He will follow-up with [place] once supplemental injections been approved.  Would like to try to stay with conservative treatments due to his claudication the fact that he is on Plavix.

## 2018-11-11 NOTE — Telephone Encounter (Signed)
Left knee gel injection ?

## 2018-11-11 NOTE — Telephone Encounter (Signed)
Noted  

## 2018-11-12 ENCOUNTER — Telehealth: Payer: Self-pay

## 2018-11-12 NOTE — Telephone Encounter (Signed)
Submitted VOB for SynviscOne, left knee. 

## 2018-11-18 ENCOUNTER — Encounter: Payer: Self-pay | Admitting: Cardiology

## 2018-11-18 ENCOUNTER — Other Ambulatory Visit: Payer: Self-pay

## 2018-11-18 ENCOUNTER — Ambulatory Visit (INDEPENDENT_AMBULATORY_CARE_PROVIDER_SITE_OTHER): Payer: Medicare Other | Admitting: Cardiology

## 2018-11-18 VITALS — BP 134/71 | HR 59 | Ht 72.0 in | Wt 195.0 lb

## 2018-11-18 DIAGNOSIS — I2510 Atherosclerotic heart disease of native coronary artery without angina pectoris: Secondary | ICD-10-CM | POA: Diagnosis not present

## 2018-11-18 DIAGNOSIS — I739 Peripheral vascular disease, unspecified: Secondary | ICD-10-CM

## 2018-11-18 NOTE — H&P (View-Only) (Signed)
Primary Physician/Referring:  Wenda Low, MD  Patient ID: Jeremy Keller, male    DOB: 1940/12/20, 78 y.o.   MRN: [id number]  Chief Complaint  Patient presents with  . Hypertension  . Follow-up    HPI: Jeremy Keller  is a 78 y.o. male  with coronary artery disease and angioplasty and stenting to his circumflex coronary artery in 2009. Past medical history significant for prior tobacco use disorder, he quit smoking in 2007, probably 10-pack-year history, hypertension, hyperlipidemia and hyperglycemia.  Due to severe symptoms of claudication involving bilateral lower extremity, he underwent peripheral arteriogram and successful angioplasty of the right mid SFA, patient has severe diffuse disease below the knee.  He now presents to the office for follow-up, states that he has noticed improvement in leg cramps in his right leg.  States that he has tolerated the procedure well without complications.  Denies any recent angina pectoris, No dyspnea, PND or orthopnea.   Past Medical History:  Diagnosis Date  . Coronary artery disease   . Diabetes mellitus without complication ([place])   . Gout   . Myocardial infarct [facility])     Past Surgical History:  Procedure Laterality Date  . CARDIAC CATHETERIZATION    . LOWER EXTREMITY ANGIOGRAPHY N/A 11/03/2018   Procedure: LOWER EXTREMITY ANGIOGRAPHY;  Surgeon: Adrian Prows, MD;  Location: [place] [facility];  Service: Cardiovascular;  Laterality: N/A;  . PERIPHERAL VASCULAR ATHERECTOMY  11/03/2018   Procedure: PERIPHERAL VASCULAR ATHERECTOMY;  Surgeon: Adrian Prows, MD;  Location: [place] [facility];  Service: Cardiovascular;;  . PERIPHERAL VASCULAR BALLOON ANGIOPLASTY  11/03/2018   Procedure: PERIPHERAL VASCULAR BALLOON ANGIOPLASTY;  Surgeon: Adrian Prows, MD;  Location: [place] [facility];  Service: Cardiovascular;;    Social History   Socioeconomic History  . Marital status: Divorced    Spouse name: Not on file  . Number of children: 14  . Years of  education: Not on file  . Highest education level: Not on file  Occupational History  . Not on file  Social Needs  . Financial resource strain: Not on file  . Food insecurity    Worry: Not on file    Inability: Not on file  . Transportation needs    Medical: Not on file    Non-medical: Not on file  Tobacco Use  . Smoking status: Former Smoker    Packs/day: 1.00    Years: 12.00    Pack years: 12.00    Types: Cigarettes    Quit date: 2007    Years since quitting: 13.6  . Smokeless tobacco: Never Used  Substance and Sexual Activity  . Alcohol use: Yes    Comment: occ  . Drug use: No  . Sexual activity: Not on file  Lifestyle  . Physical activity    Days per week: Not on file    Minutes per session: Not on file  . Stress: Not on file  Relationships  . Social Herbalist on phone: Not on file    Gets together: Not on file    Attends religious service: Not on file    Active member of club or organization: Not on file    Attends meetings of clubs or organizations: Not on file    Relationship status: Not on file  . Intimate partner violence    Fear of current or ex partner: Not on file    Emotionally abused: Not on file    Physically abused: Not on file  Forced sexual activity: Not on file  Other Topics Concern  . Not on file  Social History Narrative  . Not on file   Review of Systems  Constitution: Negative for chills, decreased appetite, malaise/fatigue and weight gain.  Cardiovascular: Positive for claudication (left calf). Negative for dyspnea on exertion, leg swelling, palpitations and syncope.  Endocrine: Negative for cold intolerance.  Hematologic/Lymphatic: Does not bruise/bleed easily.  Musculoskeletal: Positive for joint pain (bilateral thumbs) and muscle cramps (night cramps bilateral, left worse). Negative for joint swelling.  Gastrointestinal: Negative for abdominal pain, anorexia and change in bowel habit.  Genitourinary: Positive for  decreased libido (ED).  Neurological: Negative for headaches and light-headedness.  Psychiatric/Behavioral: Negative for depression and substance abuse.  All other systems reviewed and are negative.   Objective  Blood pressure 134/71, pulse (!) 59, height 6' (1.829 m), weight 195 lb (88.5 kg), SpO2 96 %. Body mass index is 26.45 kg/m.   Physical Exam  Constitutional: He appears well-developed and well-nourished. No distress.  HENT:  Head: Atraumatic.  Eyes: Conjunctivae are normal.  Neck: Neck supple. No JVD present. No thyromegaly present.  Cardiovascular: Normal rate, regular rhythm and normal heart sounds. Exam reveals decreased pulses. Exam reveals no gallop.  No murmur heard. Pulses:      Carotid pulses are 2+ on the right side and 2+ on the left side.      Femoral pulses are 2+ on the right side with bruit and 1+ on the left side with bruit.      Popliteal pulses are 2+ on the right side and 1+ on the left side.       Dorsalis pedis pulses are 0 on the right side and 0 on the left side.       Posterior tibial pulses are 0 on the right side and 0 on the left side.  Left femoral arterial access site has heale  Pulmonary/Chest: Effort normal and breath sounds normal.  Abdominal: Soft. Bowel sounds are normal.  Ventral hernia noted. Reducible  Musculoskeletal: Normal range of motion.  Neurological: He is alert.  Skin: Skin is warm and dry.  Psychiatric: He has a normal mood and affect.     Radiology: No results found.  Laboratory examination:   Labs 11/16/2018: A1c 6.2%, CBC normal, serum glucose 76 minute grams, BUN 12, creatinine 0.9 and, eGFR greater than 60 mL.  PSA mildly elevated at 5.40.  Total cholesterol 127, triglycerides 110, HDL 54, LDL 51.  Non-HDL cholesterol 73.  CMP Latest Ref Rng & Units 10/16/2018 09/22/2014 03/15/2008  Glucose 65 - 99 mg/dL 106(H) 113(H) 146(H)  BUN 8 - 27 mg/dL '10 19 12  '$ Creatinine 0.76 - 1.27 mg/dL 1.05 1.20 0.85  Sodium 134 - 144 mmol/L  143 136 140  Potassium 3.5 - 5.2 mmol/L 4.6 4.1 3.6  Chloride 96 - 106 mmol/L 103 105 107  CO2 20 - 29 mmol/L '24 22 24  '$ Calcium 8.6 - 10.2 mg/dL 9.9 9.0 9.3  Total Protein 6.0 - 8.5 g/dL 7.2 - -  Total Bilirubin 0.0 - 1.2 mg/dL 0.6 - -  Alkaline Phos 39 - 117 IU/L 73 - -  AST 0 - 40 IU/L 28 - -  ALT 0 - 44 IU/L 26 - -   CBC Latest Ref Rng & Units 10/16/2018 09/22/2014 03/15/2008  WBC 3.4 - 10.8 x10E3/uL 8.2 6.6 6.8  Hemoglobin 13.0 - 17.7 g/dL 15.8 14.4 15.0  Hematocrit 37.5 - 51.0 % 46.0 41.5 44.6  Platelets 150 -  450 x10E3/uL 232 211 219   Lipid Panel     Component Value Date/Time   CHOL 128 10/16/2018 1017   TRIG 83 10/16/2018 1017   HDL 60 10/16/2018 1017   CHOLHDL 2.9 07/10/2007 0355   VLDL 20 07/10/2007 0355   LDLCALC 51 10/16/2018 1017   HEMOGLOBIN A1C Lab Results  Component Value Date   HGBA1C 6.3 (H) 10/16/2018   MPG 147 07/09/2007   TSH Recent Labs    10/16/18 1017  TSH 1.570     PRN Meds:. Medications Discontinued During This Encounter  Medication Reason  . diclofenac sodium (VOLTAREN) 1 % GEL Error   Current Meds  Medication Sig  . allopurinol (ZYLOPRIM) 100 MG tablet Take 100 mg by mouth 2 (two) times daily.  Marland Kitchen amLODipine (NORVASC) 10 MG tablet Take 0.5 tablets (5 mg total) by mouth daily.  Marland Kitchen aspirin EC 81 MG tablet Take 162 mg by mouth daily.   Marland Kitchen atorvastatin (LIPITOR) 20 MG tablet Take 20 mg by mouth daily.   . cilostazol (PLETAL) 50 MG tablet TAKE 1 TABLET(50 MG) BY MOUTH TWICE DAILY  . clopidogrel (PLAVIX) 75 MG tablet Take 1 tablet (75 mg total) by mouth daily.  Marland Kitchen gabapentin (NEURONTIN) 100 MG capsule Take 100 mg by mouth at bedtime.   Marland Kitchen glimepiride (AMARYL) 1 MG tablet Take 1 mg by mouth daily with breakfast.  . losartan (COZAAR) 100 MG tablet Take 100 mg by mouth daily.  . pantoprazole (PROTONIX) 40 MG tablet Take 40 mg by mouth daily.    Cardiac Studies:   Abdominal aortic duplex 03/19/2017: The maximum aorta diameter is 2.76 cm (dist).  Diffuse plaque observed in the mid and distal aorta. Normal flow velocities noted in the aorta and iliac arteries. No AAA noted.  Coronary angiogram 07/09/2007: Stenting of OM branch of circumflex with 3.0 x 15 mm Promus DES.  Left main ostial 20%, mid LAD 50% stenosis.  Diagonal 2, large has mid 90% stenosis.  Exercise sestamibi stress test 11/07/2014: 1. Resting EKG demonstrates normal sinus rhythm, stress EKG was negative for myocardial ischemia.  Occasional PVCs with stress EKG. The patient performed treadmill exercise using a Bruce protocol, completing 5:01 minutes. The patient completed an estimated workload of 7.03 METS, 100% of the maximum predicted heart rate. Stress symptoms included dyspnea. 2. The perfusion imaging study demonstrates a very small-sized apical inferolateral and a small sized apical-lateral ischemia of mild intensity.  Left ventricle systolic function calculated by QGS was mildly depressed at 49%. Overall the defect is small sized and mild in intensity, low risk stress test.  The defect probably correlates with the diagonal stenosis and may also represent in-stent restenosis in the circumflex coronary artery.  Clinical correlation is recommended.  Lower Extremity Arterial Duplex 09/14/2018: Moderate velocity increase at the right distal superficial femoral artery consistent with >50% stenosis. No hemodynamically significant stenoses are identified in the left lower extremity arterial system. Diffuse disease bilateral lower extremities with biphasic waveform. This exam reveals moderately decreased perfusion of the lower extremity, noted at the post tibial artery level (ABI). Bilateral ABI 0.75 with mildly abnormal biphasic waveform at the ankles. Compared to 03/19/17, right SFA stenosis is new and ABI decreased from 0.94.  Echocardiogram 09/14/2018: Normal LV systolic function with EF 55%. Left ventricle cavity is normal in size. Mild concentric hypertrophy of the left  ventricle. Normal global wall motion. Grade I diastolic dysfunction. Calculated EF 55%. Septal motion is paradoxical. Frequent PVCs noted during the test.  Left  atrial cavity is mildly dilated. Aneurysmal interatrial septum without 2D or color Doppler evidence of interatrial shunt. Mild (Grade I) mitral regurgitation. Normal right atrial pressure.  Compared to previous study in 2016, PVC new.    Peripheral arteriogram 11/03/2018: Abdominal aortogram revealing mild atherosclerotic changes, 2 renal arteries (widely patent.  Left common iliac artery has a focal 70 to 75%, approximately followed by a 30 to 40% stenosis in the left external iliac artery.  Left SFA in the mid segment has a 50 to 60% stenosis in a tandem fashion.  Left popliteal artery is diffusely diseased but patent with less than 20 to 30% stenosis.  Left anterior tibial) left peroneal is occluded, TP trunk is severely diseased around 80%, one-vessel runoff in the form of PT in the left leg below the knee. Right common iliac, external iliac, CFA are widely patent with mild disease.  Right mid SFA 75% stenosis, right DP (and severely disease with 80% diffuse disease, right AT and peroneal artery are occluded, single-vessel involving the form of PT in the right leg below the knee. Successful atherectomy with HawkOne LS right mid to distal SFA stenosis reduced from 25% to 0% with sluggish flow improved to brisk flow.  Assessment     ICD-10-CM   1. Claudication in peripheral vascular disease (HCC)  I73.9   2. Coronary artery disease involving native coronary artery of native heart without angina pectoris  I25.10    EKG 08/11/2018: Normal sinus rhythm at the rate of 97 bpm, left atrial enlargement, rightward axis.  Incomplete right bundle branch block.  Low-voltage complexes.  Pulmonary disease pattern.   2 PVCs, multiform.  EKG 02/17/2017: Normal sinus rhythm at rate of 76 bpm, borderline criteria for left atrial enlargement, left axis  deviation.  Poor R-wave progression, cannot exclude anteroseptal infarct old.  Normal QT interval.  No evidence of ischemia. No significant change from EKG 02/21/2016.  Recommendations:   Patient underwent successful right SFA angioplasty, her systolic left iliac artery and left SFA stenosis and persistent symptoms.  His (site has healed well.  He would like to proceed with repeating this angiography for his left lower extremity.  With regard to coronary artery disease, patient remained stable without recurrence of I'll Increase.  No changes in the medications were done today.  I'll set him for peripheral arteriogram and possible angioplasty of the left iliac artery and left SFA. Continue dual antiplatelet therapy wit  Patient willing to participate in "[facility] Trial" for Lp(a) evaluation and is being screened.  Adrian Prows, MD, [facility] 11/18/2018, 10:19 PM [facility] [place] Pager: [phone] Office: [phone] If no answer Cell [phone]

## 2018-11-18 NOTE — Progress Notes (Addendum)
Primary Physician/Referring:  Wenda Low, MD  Patient ID: Jeremy Keller, male    DOB: 1940-05-06, 78 y.o.   MRN: [id number]  Chief Complaint  Patient presents with  . Hypertension  . Follow-up    HPI: Jeremy Keller  is a 78 y.o. male  with coronary artery disease and angioplasty and stenting to his circumflex coronary artery in 2009. Past medical history significant for prior tobacco use disorder, he quit smoking in 2007, probably 10-pack-year history, hypertension, hyperlipidemia and hyperglycemia.  Due to severe symptoms of claudication involving bilateral lower extremity, he underwent peripheral arteriogram and successful angioplasty of the right mid SFA, patient has severe diffuse disease below the knee.  He now presents to the office for follow-up, states that he has noticed improvement in leg cramps in his right leg.  States that he has tolerated the procedure well without complications.  Denies any recent angina pectoris, No dyspnea, PND or orthopnea.   Past Medical History:  Diagnosis Date  . Coronary artery disease   . Diabetes mellitus without complication ([place])   . Gout   . Myocardial infarct [facility])     Past Surgical History:  Procedure Laterality Date  . CARDIAC CATHETERIZATION    . LOWER EXTREMITY ANGIOGRAPHY N/A 11/03/2018   Procedure: LOWER EXTREMITY ANGIOGRAPHY;  Surgeon: Adrian Prows, MD;  Location: [place] [facility];  Service: Cardiovascular;  Laterality: N/A;  . PERIPHERAL VASCULAR ATHERECTOMY  11/03/2018   Procedure: PERIPHERAL VASCULAR ATHERECTOMY;  Surgeon: Adrian Prows, MD;  Location: [facility] [place] [facility];  Service: Cardiovascular;;  . PERIPHERAL VASCULAR BALLOON ANGIOPLASTY  11/03/2018   Procedure: PERIPHERAL VASCULAR BALLOON ANGIOPLASTY;  Surgeon: Adrian Prows, MD;  Location: [facility] [facility];  Service: Cardiovascular;;    Social History   Socioeconomic History  . Marital status: Divorced    Spouse name: Not on file  . Number of children: 14  . Years of  education: Not on file  . Highest education level: Not on file  Occupational History  . Not on file  Social Needs  . Financial resource strain: Not on file  . Food insecurity    Worry: Not on file    Inability: Not on file  . Transportation needs    Medical: Not on file    Non-medical: Not on file  Tobacco Use  . Smoking status: Former Smoker    Packs/day: 1.00    Years: 12.00    Pack years: 12.00    Types: Cigarettes    Quit date: 2007    Years since quitting: 13.6  . Smokeless tobacco: Never Used  Substance and Sexual Activity  . Alcohol use: Yes    Comment: occ  . Drug use: No  . Sexual activity: Not on file  Lifestyle  . Physical activity    Days per week: Not on file    Minutes per session: Not on file  . Stress: Not on file  Relationships  . Social Herbalist on phone: Not on file    Gets together: Not on file    Attends religious service: Not on file    Active member of club or organization: Not on file    Attends meetings of clubs or organizations: Not on file    Relationship status: Not on file  . Intimate partner violence    Fear of current or ex partner: Not on file    Emotionally abused: Not on file    Physically abused: Not on file  Forced sexual activity: Not on file  Other Topics Concern  . Not on file  Social History Narrative  . Not on file   Review of Systems  Constitution: Negative for chills, decreased appetite, malaise/fatigue and weight gain.  Cardiovascular: Positive for claudication (left calf). Negative for dyspnea on exertion, leg swelling, palpitations and syncope.  Endocrine: Negative for cold intolerance.  Hematologic/Lymphatic: Does not bruise/bleed easily.  Musculoskeletal: Positive for joint pain (bilateral thumbs) and muscle cramps (night cramps bilateral, left worse). Negative for joint swelling.  Gastrointestinal: Negative for abdominal pain, anorexia and change in bowel habit.  Genitourinary: Positive for  decreased libido (ED).  Neurological: Negative for headaches and light-headedness.  Psychiatric/Behavioral: Negative for depression and substance abuse.  All other systems reviewed and are negative.   Objective  Blood pressure 134/71, pulse (!) 59, height 6' (1.829 m), weight 195 lb (88.5 kg), SpO2 96 %. Body mass index is 26.45 kg/m.   Physical Exam  Constitutional: He appears well-developed and well-nourished. No distress.  HENT:  Head: Atraumatic.  Eyes: Conjunctivae are normal.  Neck: Neck supple. No JVD present. No thyromegaly present.  Cardiovascular: Normal rate, regular rhythm and normal heart sounds. Exam reveals decreased pulses. Exam reveals no gallop.  No murmur heard. Pulses:      Carotid pulses are 2+ on the right side and 2+ on the left side.      Femoral pulses are 2+ on the right side with bruit and 1+ on the left side with bruit.      Popliteal pulses are 2+ on the right side and 1+ on the left side.       Dorsalis pedis pulses are 0 on the right side and 0 on the left side.       Posterior tibial pulses are 0 on the right side and 0 on the left side.  Left femoral arterial access site has heale  Pulmonary/Chest: Effort normal and breath sounds normal.  Abdominal: Soft. Bowel sounds are normal.  Ventral hernia noted. Reducible  Musculoskeletal: Normal range of motion.  Neurological: He is alert.  Skin: Skin is warm and dry.  Psychiatric: He has a normal mood and affect.     Radiology: No results found.  Laboratory examination:   Labs 11/16/2018: A1c 6.2%, CBC normal, serum glucose 76 minute grams, BUN 12, creatinine 0.9 and, eGFR greater than 60 mL.  PSA mildly elevated at 5.40.  Total cholesterol 127, triglycerides 110, HDL 54, LDL 51.  Non-HDL cholesterol 73.  CMP Latest Ref Rng & Units 10/16/2018 09/22/2014 03/15/2008  Glucose 65 - 99 mg/dL 106(H) 113(H) 146(H)  BUN 8 - 27 mg/dL '10 19 12  '$ Creatinine 0.76 - 1.27 mg/dL 1.05 1.20 0.85  Sodium 134 - 144 mmol/L  143 136 140  Potassium 3.5 - 5.2 mmol/L 4.6 4.1 3.6  Chloride 96 - 106 mmol/L 103 105 107  CO2 20 - 29 mmol/L '24 22 24  '$ Calcium 8.6 - 10.2 mg/dL 9.9 9.0 9.3  Total Protein 6.0 - 8.5 g/dL 7.2 - -  Total Bilirubin 0.0 - 1.2 mg/dL 0.6 - -  Alkaline Phos 39 - 117 IU/L 73 - -  AST 0 - 40 IU/L 28 - -  ALT 0 - 44 IU/L 26 - -   CBC Latest Ref Rng & Units 10/16/2018 09/22/2014 03/15/2008  WBC 3.4 - 10.8 x10E3/uL 8.2 6.6 6.8  Hemoglobin 13.0 - 17.7 g/dL 15.8 14.4 15.0  Hematocrit 37.5 - 51.0 % 46.0 41.5 44.6  Platelets 150 -  450 x10E3/uL 232 211 219   Lipid Panel     Component Value Date/Time   CHOL 128 10/16/2018 1017   TRIG 83 10/16/2018 1017   HDL 60 10/16/2018 1017   CHOLHDL 2.9 07/10/2007 0355   VLDL 20 07/10/2007 0355   LDLCALC 51 10/16/2018 1017   HEMOGLOBIN A1C Lab Results  Component Value Date   HGBA1C 6.3 (H) 10/16/2018   MPG 147 07/09/2007   TSH Recent Labs    10/16/18 1017  TSH 1.570     PRN Meds:. Medications Discontinued During This Encounter  Medication Reason  . diclofenac sodium (VOLTAREN) 1 % GEL Error   Current Meds  Medication Sig  . allopurinol (ZYLOPRIM) 100 MG tablet Take 100 mg by mouth 2 (two) times daily.  Marland Kitchen amLODipine (NORVASC) 10 MG tablet Take 0.5 tablets (5 mg total) by mouth daily.  Marland Kitchen aspirin EC 81 MG tablet Take 162 mg by mouth daily.   Marland Kitchen atorvastatin (LIPITOR) 20 MG tablet Take 20 mg by mouth daily.   . cilostazol (PLETAL) 50 MG tablet TAKE 1 TABLET(50 MG) BY MOUTH TWICE DAILY  . clopidogrel (PLAVIX) 75 MG tablet Take 1 tablet (75 mg total) by mouth daily.  Marland Kitchen gabapentin (NEURONTIN) 100 MG capsule Take 100 mg by mouth at bedtime.   Marland Kitchen glimepiride (AMARYL) 1 MG tablet Take 1 mg by mouth daily with breakfast.  . losartan (COZAAR) 100 MG tablet Take 100 mg by mouth daily.  . pantoprazole (PROTONIX) 40 MG tablet Take 40 mg by mouth daily.    Cardiac Studies:   Abdominal aortic duplex 03/19/2017: The maximum aorta diameter is 2.76 cm (dist).  Diffuse plaque observed in the mid and distal aorta. Normal flow velocities noted in the aorta and iliac arteries. No AAA noted.  Coronary angiogram 07/09/2007: Stenting of OM branch of circumflex with 3.0 x 15 mm Promus DES.  Left main ostial 20%, mid LAD 50% stenosis.  Diagonal 2, large has mid 90% stenosis.  Exercise sestamibi stress test 11/07/2014: 1. Resting EKG demonstrates normal sinus rhythm, stress EKG was negative for myocardial ischemia.  Occasional PVCs with stress EKG. The patient performed treadmill exercise using a Bruce protocol, completing 5:01 minutes. The patient completed an estimated workload of 7.03 METS, 100% of the maximum predicted heart rate. Stress symptoms included dyspnea. 2. The perfusion imaging study demonstrates a very small-sized apical inferolateral and a small sized apical-lateral ischemia of mild intensity.  Left ventricle systolic function calculated by QGS was mildly depressed at 49%. Overall the defect is small sized and mild in intensity, low risk stress test.  The defect probably correlates with the diagonal stenosis and may also represent in-stent restenosis in the circumflex coronary artery.  Clinical correlation is recommended.  Lower Extremity Arterial Duplex 09/14/2018: Moderate velocity increase at the right distal superficial femoral artery consistent with >50% stenosis. No hemodynamically significant stenoses are identified in the left lower extremity arterial system. Diffuse disease bilateral lower extremities with biphasic waveform. This exam reveals moderately decreased perfusion of the lower extremity, noted at the post tibial artery level (ABI). Bilateral ABI 0.75 with mildly abnormal biphasic waveform at the ankles. Compared to 03/19/17, right SFA stenosis is new and ABI decreased from 0.94.  Echocardiogram 09/14/2018: Normal LV systolic function with EF 55%. Left ventricle cavity is normal in size. Mild concentric hypertrophy of the left  ventricle. Normal global wall motion. Grade I diastolic dysfunction. Calculated EF 55%. Septal motion is paradoxical. Frequent PVCs noted during the test.  Left  atrial cavity is mildly dilated. Aneurysmal interatrial septum without 2D or color Doppler evidence of interatrial shunt. Mild (Grade I) mitral regurgitation. Normal right atrial pressure.  Compared to previous study in 2016, PVC new.    Peripheral arteriogram 11/03/2018: Abdominal aortogram revealing mild atherosclerotic changes, 2 renal arteries (widely patent.  Left common iliac artery has a focal 70 to 75%, approximately followed by a 30 to 40% stenosis in the left external iliac artery.  Left SFA in the mid segment has a 50 to 60% stenosis in a tandem fashion.  Left popliteal artery is diffusely diseased but patent with less than 20 to 30% stenosis.  Left anterior tibial) left peroneal is occluded, TP trunk is severely diseased around 80%, one-vessel runoff in the form of PT in the left leg below the knee. Right common iliac, external iliac, CFA are widely patent with mild disease.  Right mid SFA 75% stenosis, right DP (and severely disease with 80% diffuse disease, right AT and peroneal artery are occluded, single-vessel involving the form of PT in the right leg below the knee. Successful atherectomy with HawkOne LS right mid to distal SFA stenosis reduced from 25% to 0% with sluggish flow improved to brisk flow.  Assessment     ICD-10-CM   1. Claudication in peripheral vascular disease (HCC)  I73.9   2. Coronary artery disease involving native coronary artery of native heart without angina pectoris  I25.10    EKG 08/11/2018: Normal sinus rhythm at the rate of 97 bpm, left atrial enlargement, rightward axis.  Incomplete right bundle branch block.  Low-voltage complexes.  Pulmonary disease pattern.   2 PVCs, multiform.  EKG 02/17/2017: Normal sinus rhythm at rate of 76 bpm, borderline criteria for left atrial enlargement, left axis  deviation.  Poor R-wave progression, cannot exclude anteroseptal infarct old.  Normal QT interval.  No evidence of ischemia. No significant change from EKG 02/21/2016.  Recommendations:   Patient underwent successful right SFA angioplasty, her systolic left iliac artery and left SFA stenosis and persistent symptoms.  His (site has healed well.  He would like to proceed with repeating this angiography for his left lower extremity.  With regard to coronary artery disease, patient remained stable without recurrence of I'll Increase.  No changes in the medications were done today.  I'll set him for peripheral arteriogram and possible angioplasty of the left iliac artery and left SFA. Continue dual antiplatelet therapy wit  Patient willing to participate in "[facility] Trial" for Lp(a) evaluation and is being screened.  Adrian Prows, MD, [facility] 11/18/2018, 10:19 PM [facility]. [place] Pager: [phone] Office: [phone] If no answer Cell [phone]

## 2018-11-20 ENCOUNTER — Telehealth: Payer: Self-pay

## 2018-11-20 NOTE — Telephone Encounter (Signed)
Patient aware that he is approved for gel injection.  Approved for SynviscOne, left knee. [place] Patient will be responsible for 20% OOP. No Co-pay No PA required  Appt. 11/23/2018 with Dr. Ninfa Linden

## 2018-11-23 ENCOUNTER — Ambulatory Visit (INDEPENDENT_AMBULATORY_CARE_PROVIDER_SITE_OTHER): Payer: Medicare Other | Admitting: Orthopaedic Surgery

## 2018-11-23 DIAGNOSIS — M1712 Unilateral primary osteoarthritis, left knee: Secondary | ICD-10-CM

## 2018-11-23 NOTE — Progress Notes (Signed)
The patient has known significant osteoarthritis in his left knee and was here today to have a scheduled hyaluronic acid injection in her left knee.  He apparently is having a vascular intervention for circulation issues in the left lower extremity in a month from now.  He would like to hold off on any type of injection in the knee due to that.  Also the package insert on information we gave him said to talk to his physician if there is any circulation issues in his legs.  I gave him reassurance that injections such as this do not affect the circulation and usually we need to give these injections for people that cannot undergo joint replacement surgery due to circulation issues and I feel this would be an appropriate step for him to help treat the pain from his osteoarthritis.  All question concerns were answered and addressed.  We will see him back at his convenience to provide I hyaluronic acid in the left knee when he is comfortable and he is well past any intervention that has been performed to improve the circulation to his legs.

## 2018-12-10 ENCOUNTER — Telehealth: Payer: Self-pay

## 2018-12-10 NOTE — Telephone Encounter (Signed)
Pt aware.

## 2018-12-10 NOTE — Telephone Encounter (Signed)
Nope, it is fine

## 2018-12-10 NOTE — Telephone Encounter (Signed)
Pt called stating that his chest feels like "vibrating" and the hosp says he needs to come back and see you before his surgery 12/15/2018. Should we have him come in for EKG?

## 2018-12-11 ENCOUNTER — Other Ambulatory Visit (HOSPITAL_COMMUNITY)
Admission: RE | Admit: 2018-12-11 | Discharge: 2018-12-11 | Disposition: A | Discharge status: Home / Self Care | Payer: Medicare Other | Source: Ambulatory Visit | Attending: Cardiology | Admitting: Cardiology

## 2018-12-11 DIAGNOSIS — Z20828 Contact with and (suspected) exposure to other viral communicable diseases: Secondary | ICD-10-CM | POA: Diagnosis not present

## 2018-12-11 DIAGNOSIS — Z01812 Encounter for preprocedural laboratory examination: Secondary | ICD-10-CM | POA: Diagnosis present

## 2018-12-12 LAB — NOVEL CORONAVIRUS, NAA (HOSP ORDER, SEND-OUT TO REF LAB; TAT 18-24 HRS): SARS-CoV-2, NAA: NOT DETECTED

## 2018-12-15 ENCOUNTER — Ambulatory Visit (HOSPITAL_COMMUNITY)
Admission: RE | Admit: 2018-12-15 | Discharge: 2018-12-15 | Disposition: A | Discharge status: Home / Self Care | Payer: Medicare Other | Attending: Cardiology | Admitting: Cardiology

## 2018-12-15 ENCOUNTER — Encounter (HOSPITAL_COMMUNITY)
Admission: RE | Disposition: A | Discharge status: Home / Self Care | Payer: Self-pay | Source: Home / Self Care | Attending: Cardiology

## 2018-12-15 ENCOUNTER — Other Ambulatory Visit: Payer: Self-pay

## 2018-12-15 DIAGNOSIS — M109 Gout, unspecified: Secondary | ICD-10-CM | POA: Insufficient documentation

## 2018-12-15 DIAGNOSIS — E785 Hyperlipidemia, unspecified: Secondary | ICD-10-CM | POA: Insufficient documentation

## 2018-12-15 DIAGNOSIS — Z87891 Personal history of nicotine dependence: Secondary | ICD-10-CM | POA: Insufficient documentation

## 2018-12-15 DIAGNOSIS — Z7982 Long term (current) use of aspirin: Secondary | ICD-10-CM | POA: Diagnosis not present

## 2018-12-15 DIAGNOSIS — I10 Essential (primary) hypertension: Secondary | ICD-10-CM | POA: Insufficient documentation

## 2018-12-15 DIAGNOSIS — Z7902 Long term (current) use of antithrombotics/antiplatelets: Secondary | ICD-10-CM | POA: Diagnosis not present

## 2018-12-15 DIAGNOSIS — I252 Old myocardial infarction: Secondary | ICD-10-CM | POA: Diagnosis not present

## 2018-12-15 DIAGNOSIS — Z79899 Other long term (current) drug therapy: Secondary | ICD-10-CM | POA: Diagnosis not present

## 2018-12-15 DIAGNOSIS — E1151 Type 2 diabetes mellitus with diabetic peripheral angiopathy without gangrene: Secondary | ICD-10-CM | POA: Insufficient documentation

## 2018-12-15 DIAGNOSIS — I70212 Atherosclerosis of native arteries of extremities with intermittent claudication, left leg: Secondary | ICD-10-CM | POA: Diagnosis not present

## 2018-12-15 DIAGNOSIS — I2510 Atherosclerotic heart disease of native coronary artery without angina pectoris: Secondary | ICD-10-CM | POA: Diagnosis not present

## 2018-12-15 DIAGNOSIS — I739 Peripheral vascular disease, unspecified: Secondary | ICD-10-CM | POA: Diagnosis present

## 2018-12-15 HISTORY — PX: PERIPHERAL VASCULAR INTERVENTION: CATH118257

## 2018-12-15 HISTORY — PX: LOWER EXTREMITY ANGIOGRAPHY: CATH118251

## 2018-12-15 LAB — GLUCOSE, CAPILLARY
Glucose-Capillary: 117 mg/dL — ABNORMAL HIGH (ref 70–99)
Glucose-Capillary: 100 mg/dL — ABNORMAL HIGH (ref 70–99)

## 2018-12-15 LAB — BASIC METABOLIC PANEL
Anion gap: 11 (ref 5–15)
BUN: 8 mg/dL (ref 8–23)
CO2: 25 mmol/L (ref 22–32)
Calcium: 9.1 mg/dL (ref 8.9–10.3)
Chloride: 104 mmol/L (ref 98–111)
Creatinine, Ser: 0.96 mg/dL (ref 0.61–1.24)
GFR calc Af Amer: >60 mL/min (ref >60)
GFR calc non Af Amer: >60 mL/min (ref >60)
Glucose, Bld: 128 mg/dL — ABNORMAL HIGH (ref 70–99)
Potassium: 3.7 mmol/L (ref 3.5–5.1)
Sodium: 140 mmol/L (ref 135–145)

## 2018-12-15 LAB — CBC
HCT: 44.8 % (ref 39.0–52.0)
Hemoglobin: 15 g/dL (ref 13.0–17.0)
MCH: 30.7 pg (ref 26.0–34.0)
MCHC: 33.5 g/dL (ref 30.0–36.0)
MCV: 91.8 fL (ref 80.0–100.0)
Platelets: 217 10*3/uL (ref 150–400)
RBC: 4.88 MIL/uL (ref 4.22–5.81)
RDW: 13.6 % (ref 11.5–15.5)
WBC: 6.1 10*3/uL (ref 4.0–10.5)
nRBC: 0 % (ref 0.0–0.2)

## 2018-12-15 LAB — POCT ACTIVATED CLOTTING TIME: Activated Clotting Time: 224 seconds

## 2018-12-15 SURGERY — LOWER EXTREMITY ANGIOGRAPHY
Anesthesia: LOCAL | Laterality: Left

## 2018-12-15 MED ORDER — ONDANSETRON HCL 4 MG/2ML IJ SOLN: 4.0000 mg | Freq: Four times a day (QID) | INTRAMUSCULAR | Status: DC | PRN

## 2018-12-15 MED ORDER — SODIUM CHLORIDE 0.9 % IV SOLN: 250.0000 mL | INTRAVENOUS | Status: DC | PRN

## 2018-12-15 MED ORDER — HEPARIN SODIUM (PORCINE) 1000 UNIT/ML IJ SOLN
INTRAMUSCULAR | Status: DC | PRN
  Administered 2018-12-15: 2000 [IU] via INTRAVENOUS
  Administered 2018-12-15: 6000 [IU] via INTRAVENOUS

## 2018-12-15 MED ORDER — SODIUM CHLORIDE 0.9% FLUSH: 3.0000 mL | INTRAVENOUS | Status: DC | PRN

## 2018-12-15 MED ORDER — SODIUM CHLORIDE 0.9% FLUSH: 3.0000 mL | Freq: Two times a day (BID) | INTRAVENOUS | Status: DC

## 2018-12-15 MED ORDER — HEPARIN (PORCINE) IN NACL 1000-0.9 UT/500ML-% IV SOLN
INTRAVENOUS | Status: AC
  Filled 2018-12-15: qty 1000

## 2018-12-15 MED ORDER — HEPARIN (PORCINE) IN NACL 1000-0.9 UT/500ML-% IV SOLN
INTRAVENOUS | Status: DC | PRN
  Administered 2018-12-15: 500 mL

## 2018-12-15 MED ORDER — LABETALOL HCL 5 MG/ML IV SOLN: 10.0000 mg | INTRAVENOUS | Status: DC | PRN

## 2018-12-15 MED ORDER — SODIUM CHLORIDE 0.9 % IV SOLN
INTRAVENOUS | Status: DC
  Administered 2018-12-15: 10:00:00 via INTRAVENOUS

## 2018-12-15 MED ORDER — LIDOCAINE HCL (PF) 1 % IJ SOLN
INTRAMUSCULAR | Status: DC | PRN
  Administered 2018-12-15: 20 mL

## 2018-12-15 MED ORDER — MIDAZOLAM HCL 2 MG/2ML IJ SOLN
INTRAMUSCULAR | Status: AC
  Filled 2018-12-15: qty 2

## 2018-12-15 MED ORDER — MIDAZOLAM HCL 2 MG/2ML IJ SOLN
INTRAMUSCULAR | Status: DC | PRN
  Administered 2018-12-15: 2 mg via INTRAVENOUS

## 2018-12-15 MED ORDER — FENTANYL CITRATE (PF) 100 MCG/2ML IJ SOLN
INTRAMUSCULAR | Status: DC | PRN
  Administered 2018-12-15: 50 ug via INTRAVENOUS

## 2018-12-15 MED ORDER — HEPARIN (PORCINE) IN NACL 1000-0.9 UT/500ML-% IV SOLN
INTRAVENOUS | Status: AC
  Filled 2018-12-15: qty 500

## 2018-12-15 MED ORDER — HYDRALAZINE HCL 20 MG/ML IJ SOLN: 5.0000 mg | INTRAMUSCULAR | Status: DC | PRN

## 2018-12-15 MED ORDER — IODIXANOL 320 MG/ML IV SOLN
INTRAVENOUS | Status: DC | PRN
  Administered 2018-12-15: 10:00:00 125 mL via INTRA_ARTERIAL

## 2018-12-15 MED ORDER — ACETAMINOPHEN 325 MG PO TABS: 650.0000 mg | ORAL_TABLET | ORAL | Status: DC | PRN

## 2018-12-15 MED ORDER — FENTANYL CITRATE (PF) 100 MCG/2ML IJ SOLN
INTRAMUSCULAR | Status: AC
  Filled 2018-12-15: qty 2

## 2018-12-15 MED ORDER — HEPARIN SODIUM (PORCINE) 1000 UNIT/ML IJ SOLN
INTRAMUSCULAR | Status: AC
  Filled 2018-12-15: qty 1

## 2018-12-15 MED ORDER — LIDOCAINE HCL (PF) 1 % IJ SOLN
INTRAMUSCULAR | Status: AC
  Filled 2018-12-15: qty 30

## 2018-12-15 MED ORDER — SODIUM CHLORIDE 0.9 % IV BOLUS
500.0000 mL | Freq: Once | INTRAVENOUS | Status: AC
  Administered 2018-12-15: 1000 mL via INTRAVENOUS

## 2018-12-15 MED ORDER — SODIUM CHLORIDE 0.9 % IV SOLN: INTRAVENOUS | Status: AC

## 2018-12-15 SURGICAL SUPPLY — 27 items
BALLN MUSTANG 12X20X75 (BALLOONS) ×3
BALLN MUSTANG 6.0X40 75 (BALLOONS) ×3
BALLN MUSTANG 6X60X75 (BALLOONS) ×3
BALLOON MUSTANG 12X20X75 (BALLOONS) IMPLANT
BALLOON MUSTANG 6.0X40 75 (BALLOONS) IMPLANT
BALLOON MUSTANG 6X60X75 (BALLOONS) IMPLANT
CATH CROSS OVER TEMPO 5F (CATHETERS) ×1 IMPLANT
CATH STRAIGHT 5FR 65CM (CATHETERS) ×1 IMPLANT
CATH TEMPO AQUA 5F 100CM (CATHETERS) ×1 IMPLANT
CLOSURE MYNX CONTROL 6F/7F (Vascular Products) ×1 IMPLANT
DEVICE TORQUE .025-.038 (MISCELLANEOUS) ×1 IMPLANT
GLIDEWIRE ADV .035X180CM (WIRE) ×1 IMPLANT
GUIDEWIRE ANGLED .035X150CM (WIRE) ×1 IMPLANT
KIT ENCORE 26 ADVANTAGE (KITS) ×1 IMPLANT
KIT MICROPUNCTURE NIT STIFF (SHEATH) ×1 IMPLANT
KIT PV (KITS) ×3 IMPLANT
SHEATH FLEXOR ANSEL 1 7F 45CM (SHEATH) ×1 IMPLANT
SHEATH PINNACLE 5F 10CM (SHEATH) ×1 IMPLANT
SHEATH PINNACLE 7F 10CM (SHEATH) ×1 IMPLANT
SHEATH PROBE COVER 6X72 (BAG) ×1 IMPLANT
STENT OMNILINK ELITE 10X59X80 (Permanent Stent) ×1 IMPLANT
STOPCOCK MORSE 400PSI 3WAY (MISCELLANEOUS) ×1 IMPLANT
SYR MEDRAD MARK 7 150ML (SYRINGE) ×3 IMPLANT
TRANSDUCER W/STOPCOCK (MISCELLANEOUS) ×3 IMPLANT
TRAY PV CATH (CUSTOM PROCEDURE TRAY) ×3 IMPLANT
TUBING CIL FLEX 10 FLL-RA (TUBING) ×1 IMPLANT
WIRE BENTSON .035X145CM (WIRE) ×1 IMPLANT

## 2018-12-15 NOTE — Progress Notes (Signed)
Discharge instructions reviewed with Jenny Reichmann (friend) voices understanding.

## 2018-12-15 NOTE — Discharge Instructions (Signed)
Femoral Site Care °This sheet gives you information about how to care for yourself after your procedure. Your health care provider may also give you more specific instructions. If you have problems or questions, contact your health care provider. °What can I expect after the procedure? °After the procedure, it is common to have: °· Bruising that usually fades within 1-2 weeks. °· Tenderness at the site. °Follow these instructions at home: °Wound care °· Follow instructions from your health care provider about how to take care of your insertion site. Make sure you: °? Wash your hands with soap and water before you change your bandage (dressing). If soap and water are not available, use hand sanitizer. °? Change your dressing as told by your health care provider. °? Leave stitches (sutures), skin glue, or adhesive strips in place. These skin closures may need to stay in place for 2 weeks or longer. If adhesive strip edges start to loosen and curl up, you may trim the loose edges. Do not remove adhesive strips completely unless your health care provider tells you to do that. °· Do not take baths, swim, or use a hot tub until your health care provider approves. °· You may shower 24-48 hours after the procedure or as told by your health care provider. °? Gently wash the site with plain soap and water. °? Pat the area dry with a clean towel. °? Do not rub the site. This may cause bleeding. °· Do not apply powder or lotion to the site. Keep the site clean and dry. °· Check your femoral site every day for signs of infection. Check for: °? Redness, swelling, or pain. °? Fluid or blood. °? Warmth. °? Pus or a bad smell. °Activity °· For the first 2-3 days after your procedure, or as long as directed: °? Avoid climbing stairs as much as possible. °? Do not squat. °· Do not lift anything that is heavier than 10 lb (4.5 kg), or the limit that you are told, until your health care provider says that it is safe. °· Rest as  directed. °? Avoid sitting for a long time without moving. Get up to take short walks every 1-2 hours. °· Do not drive for 24 hours if you were given a medicine to help you relax (sedative). °General instructions °· Take over-the-counter and prescription medicines only as told by your health care provider. °· Keep all follow-up visits as told by your health care provider. This is important. °Contact a health care provider if you have: °· A fever or chills. °· You have redness, swelling, or pain around your insertion site. °Get help right away if: °· The catheter insertion area swells very fast. °· You pass out. °· You suddenly start to sweat or your skin gets clammy. °· The catheter insertion area is bleeding, and the bleeding does not stop when you hold steady pressure on the area. °· The area near or just beyond the catheter insertion site becomes pale, cool, tingly, or numb. °These symptoms may represent a serious problem that is an emergency. Do not wait to see if the symptoms will go away. Get medical help right away. Call your local emergency services (911 in the [place] Do not drive yourself to the hospital. °Summary °· After the procedure, it is common to have bruising that usually fades within 1-2 weeks. °· Check your femoral site every day for signs of infection. °· Do not lift anything that is heavier than 10 lb (4.5 kg), or the   limit that you are told, until your health care provider says that it is safe. °This information is not intended to replace advice given to you by your health care provider. Make sure you discuss any questions you have with your health care provider. °Document Released: 11/19/2013 Document Revised: 03/31/2017 Document Reviewed: 03/31/2017 °Elsevier Patient Education © 2020 [employer] ° °

## 2018-12-15 NOTE — Progress Notes (Signed)
Attempted to call John no answer. Pt states he has spoken to him. Asked pt to have him call us. Discharge instructions reviewed with pt. Voices understanding.

## 2018-12-15 NOTE — Interval H&P Note (Signed)
History and Physical Interval Note:  12/15/2018 7:42 AM  Jeremy Keller  has presented today for surgery, with the diagnosis of PAD.  The various methods of treatment have been discussed with the patient and family. After consideration of risks, benefits and other options for treatment, the patient has consented to  Procedure(s): LOWER EXTREMITY ANGIOGRAPHY (Left) and possible intervention  as a surgical intervention.  The patient's history has been reviewed, patient examined, no change in status, stable for surgery.  I have reviewed the patient's chart and labs.  Questions were answered to the patient's satisfaction.     Adrian Prows

## 2018-12-15 NOTE — Progress Notes (Signed)
Ambulated in hallway tol well  

## 2018-12-15 NOTE — Progress Notes (Signed)
Attempted to call pt friend Jenny Reichmann message left for him to return call.

## 2018-12-16 ENCOUNTER — Other Ambulatory Visit: Payer: Self-pay | Admitting: Cardiology

## 2018-12-16 ENCOUNTER — Encounter (HOSPITAL_COMMUNITY): Payer: Self-pay | Admitting: Cardiology

## 2018-12-16 DIAGNOSIS — I739 Peripheral vascular disease, unspecified: Secondary | ICD-10-CM

## 2018-12-18 ENCOUNTER — Ambulatory Visit (INDEPENDENT_AMBULATORY_CARE_PROVIDER_SITE_OTHER): Payer: Medicare Other

## 2018-12-18 ENCOUNTER — Other Ambulatory Visit: Payer: Self-pay

## 2018-12-18 DIAGNOSIS — I739 Peripheral vascular disease, unspecified: Secondary | ICD-10-CM | POA: Diagnosis not present

## 2018-12-20 ENCOUNTER — Other Ambulatory Visit: Payer: Self-pay | Admitting: Cardiology

## 2018-12-20 DIAGNOSIS — I739 Peripheral vascular disease, unspecified: Secondary | ICD-10-CM

## 2018-12-31 ENCOUNTER — Other Ambulatory Visit: Payer: Self-pay

## 2018-12-31 ENCOUNTER — Encounter: Payer: Self-pay | Admitting: Cardiology

## 2018-12-31 ENCOUNTER — Ambulatory Visit (INDEPENDENT_AMBULATORY_CARE_PROVIDER_SITE_OTHER): Payer: Medicare Other | Admitting: Cardiology

## 2018-12-31 VITALS — BP 127/84 | HR 89 | Ht 72.0 in | Wt 196.5 lb

## 2018-12-31 DIAGNOSIS — E7800 Pure hypercholesterolemia, unspecified: Secondary | ICD-10-CM

## 2018-12-31 DIAGNOSIS — I739 Peripheral vascular disease, unspecified: Secondary | ICD-10-CM

## 2018-12-31 DIAGNOSIS — I10 Essential (primary) hypertension: Secondary | ICD-10-CM

## 2018-12-31 NOTE — Progress Notes (Signed)
Primary Physician/Referring:  Wenda Low, MD  Patient ID: Jeremy Keller, male    DOB: 05/11/1940, 78 y.o.   MRN: [id number]  Chief Complaint  Patient presents with  . Hypertension  . Follow-up    HPI: [name]  is a 78 y.o. male  with coronary artery disease and angioplasty and stenting to his circumflex coronary artery in 2009. Past medical history significant for prior tobacco use disorder, he quit smoking in 2007, probably 10-pack-year history, hypertension, hyperlipidemia and hyperglycemia. Due to severe symptoms of claudication, patient underwent peripheral arteriogram on 11/03/2018 and also on 12/15/2018 and underwent staged intervention to right mid and distal SFA using Hawk  One directional atherectomy and also left common iliac artery stenting respectively.  He now presents for follow-up.  States that symptoms of claudication have essentially resolved.  He is now able to do his activities without any limitations.    Denies any recent angina pectoris, No dyspnea, PND or orthopnea.   Past Medical History:  Diagnosis Date  . Coronary artery disease   . Diabetes mellitus without complication ([place])   . Gout   . Myocardial infarct [facility])     Past Surgical History:  Procedure Laterality Date  . CARDIAC CATHETERIZATION    . LOWER EXTREMITY ANGIOGRAPHY N/A 11/03/2018   Procedure: LOWER EXTREMITY ANGIOGRAPHY;  Surgeon: Adrian Prows, MD;  Location: [place] [facility];  Service: Cardiovascular;  Laterality: N/A;  . LOWER EXTREMITY ANGIOGRAPHY Left 12/15/2018   Procedure: LOWER EXTREMITY ANGIOGRAPHY;  Surgeon: Nigel Mormon, MD;  Location: [place] [facility];  Service: Cardiovascular;  Laterality: Left;  . PERIPHERAL VASCULAR ATHERECTOMY  11/03/2018   Procedure: PERIPHERAL VASCULAR ATHERECTOMY;  Surgeon: Adrian Prows, MD;  Location: [place] [facility];  Service: Cardiovascular;;  . PERIPHERAL VASCULAR BALLOON ANGIOPLASTY  11/03/2018   Procedure: PERIPHERAL VASCULAR BALLOON ANGIOPLASTY;   Surgeon: Adrian Prows, MD;  Location: [place] [facility];  Service: Cardiovascular;;  . PERIPHERAL VASCULAR INTERVENTION  12/15/2018   Procedure: PERIPHERAL VASCULAR INTERVENTION;  Surgeon: Nigel Mormon, MD;  Location: [place] [facility];  Service: Cardiovascular;;  left common iliac    Social History   Socioeconomic History  . Marital status: Divorced    Spouse name: Not on file  . Number of children: 14  . Years of education: Not on file  . Highest education level: Not on file  Occupational History  . Not on file  Social Needs  . Financial resource strain: Not on file  . Food insecurity    Worry: Not on file    Inability: Not on file  . Transportation needs    Medical: Not on file    Non-medical: Not on file  Tobacco Use  . Smoking status: Former Smoker    Packs/day: 1.00    Years: 12.00    Pack years: 12.00    Types: Cigarettes    Quit date: 2007    Years since quitting: 13.7  . Smokeless tobacco: Never Used  Substance and Sexual Activity  . Alcohol use: Yes    Comment: occ  . Drug use: No  . Sexual activity: Not on file  Lifestyle  . Physical activity    Days per week: Not on file    Minutes per session: Not on file  . Stress: Not on file  Relationships  . Social Herbalist on phone: Not on file    Gets together: Not on file    Attends religious service:  Not on file    Active member of club or organization: Not on file    Attends meetings of clubs or organizations: Not on file    Relationship status: Not on file  . Intimate partner violence    Fear of current or ex partner: Not on file    Emotionally abused: Not on file    Physically abused: Not on file    Forced sexual activity: Not on file  Other Topics Concern  . Not on file  Social History Narrative  . Not on file   Review of Systems  Constitution: Negative for chills, decreased appetite, malaise/fatigue and weight gain.  Cardiovascular: Negative for claudication, dyspnea on  exertion, leg swelling, palpitations and syncope.  Endocrine: Negative for cold intolerance.  Hematologic/Lymphatic: Does not bruise/bleed easily.  Musculoskeletal: Positive for joint pain (bilateral thumbs). Negative for joint swelling and muscle cramps.  Gastrointestinal: Negative for abdominal pain, anorexia and change in bowel habit.  Genitourinary: Positive for decreased libido (ED).  Neurological: Negative for headaches and light-headedness.  Psychiatric/Behavioral: Negative for depression and substance abuse.  All other systems reviewed and are negative.   Objective  Blood pressure 127/84, pulse 89, height 6' (1.829 m), weight 196 lb 8 oz (89.1 kg), SpO2 97 %. Body mass index is 26.65 kg/m.   Physical Exam  Constitutional: He appears well-developed and well-nourished. No distress.  HENT:  Head: Atraumatic.  Eyes: Conjunctivae are normal.  Neck: Neck supple. No JVD present. No thyromegaly present.  Cardiovascular: Normal rate, regular rhythm and normal heart sounds. Exam reveals decreased pulses. Exam reveals no gallop.  No murmur heard. Pulses:      Carotid pulses are 2+ on the right side and 2+ on the left side.      Femoral pulses are 2+ on the right side with bruit and 2+ on the left side with bruit.      Popliteal pulses are 2+ on the right side and 2+ on the left side.       Dorsalis pedis pulses are 0 on the right side and 0 on the left side.       Posterior tibial pulses are 0 on the right side and 0 on the left side.  No edema. Capillary refill normal.   Pulmonary/Chest: Effort normal and breath sounds normal.  Abdominal: Soft. Bowel sounds are normal.  Ventral hernia noted. Reducible  Musculoskeletal: Normal range of motion.  Neurological: He is alert.  Skin: Skin is warm and dry.  Psychiatric: He has a normal mood and affect.     Radiology: No results found.  Laboratory examination:   Labs 11/16/2018: A1c 6.2%, CBC normal, serum glucose 76 minute grams, BUN  12, creatinine 0.9 and, eGFR greater than 60 mL.  PSA mildly elevated at 5.40.  Total cholesterol 127, triglycerides 110, HDL 54, LDL 51.  Non-HDL cholesterol 73.  CMP Latest Ref Rng & Units 12/15/2018 10/16/2018 09/22/2014  Glucose 70 - 99 mg/dL 128(H) 106(H) 113(H)  BUN 8 - 23 mg/dL _0 Creatinine 0.61 - 1.24 mg/dL 0.96 1.05 1.20  Sodium 135 - 145 mmol/L 140 143 136  Potassium 3.5 - 5.1 mmol/L 3.7 4.6 4.1  Chloride 98 - 111 mmol/L 104 103 105  CO2 22 - 32 mmol/L _1 Calcium 8.9 - 10.3 mg/dL 9.1 9.9 9.0  Total Protein 6.0 - 8.5 g/dL - 7.2 -  Total Bilirubin 0.0 - 1.2 mg/dL - 0.6 -  Alkaline Phos 39 - 117 IU/L -  73 -  AST 0 - 40 IU/L - 28 -  ALT 0 - 44 IU/L - 26 -   CBC Latest Ref Rng & Units 12/15/2018 10/16/2018 09/22/2014  WBC 4.0 - 10.5 K/uL 6.1 8.2 6.6  Hemoglobin 13.0 - 17.0 g/dL 15.0 15.8 14.4  Hematocrit 39.0 - 52.0 % 44.8 46.0 41.5  Platelets 150 - 400 K/uL 217 232 211   Lipid Panel     Component Value Date/Time   CHOL 128 10/16/2018 1017   TRIG 83 10/16/2018 1017   HDL 60 10/16/2018 1017   CHOLHDL 2.9 07/10/2007 0355   VLDL 20 07/10/2007 0355   LDLCALC 51 10/16/2018 1017   HEMOGLOBIN A1C Lab Results  Component Value Date   HGBA1C 6.3 (H) 10/16/2018   MPG 147 07/09/2007   TSH Recent Labs    10/16/18 1017  TSH 1.570     PRN Meds:. Medications Discontinued During This Encounter  Medication Reason  . clopidogrel (PLAVIX) 75 MG tablet Error   Current Meds  Medication Sig  . allopurinol (ZYLOPRIM) 100 MG tablet Take 100 mg by mouth 2 (two) times daily.  Marland Kitchen amLODipine (NORVASC) 10 MG tablet Take 0.5 tablets (5 mg total) by mouth daily.  Marland Kitchen aspirin EC 81 MG tablet Take 162 mg by mouth daily.   Marland Kitchen atorvastatin (LIPITOR) 20 MG tablet Take 20 mg by mouth daily.   . cilostazol (PLETAL) 50 MG tablet TAKE 1 TABLET(50 MG) BY MOUTH TWICE DAILY (Patient taking differently: Take 50 mg by mouth 2 (two) times daily. )  . gabapentin (NEURONTIN) 100 MG capsule Take 100  mg by mouth at bedtime.   Marland Kitchen glimepiride (AMARYL) 1 MG tablet Take 1 mg by mouth daily with breakfast.  . indomethacin (INDOCIN) 50 MG capsule Take 50 mg by mouth daily.  Marland Kitchen losartan (COZAAR) 100 MG tablet Take 100 mg by mouth daily.  . pantoprazole (PROTONIX) 40 MG tablet Take 40 mg by mouth daily.    Cardiac Studies:   Abdominal aortic duplex 03/19/2017: The maximum aorta diameter is 2.76 cm (dist). Diffuse plaque observed in the mid and distal aorta. Normal flow velocities noted in the aorta and iliac arteries. No AAA noted.  Coronary angiogram 07/09/2007: Stenting of OM branch of circumflex with 3.0 x 15 mm Promus DES.  Left main ostial 20%, mid LAD 50% stenosis.  Diagonal 2, large has mid 90% stenosis.  Exercise sestamibi stress test 11/07/2014: 1. Resting EKG demonstrates normal sinus rhythm, stress EKG was negative for myocardial ischemia.  Occasional PVCs with stress EKG. The patient performed treadmill exercise using a Bruce protocol, completing 5:01 minutes. The patient completed an estimated workload of 7.03 METS, 100% of the maximum predicted heart rate. Stress symptoms included dyspnea. 2. The perfusion imaging study demonstrates a very small-sized apical inferolateral and a small sized apical-lateral ischemia of mild intensity.  Left ventricle systolic function calculated by QGS was mildly depressed at 49%. Overall the defect is small sized and mild in intensity, low risk stress test.  The defect probably correlates with the diagonal stenosis and may also represent in-stent restenosis in the circumflex coronary artery.  Clinical correlation is recommended.  Lower Extremity Arterial Duplex 09/14/2018: Moderate velocity increase at the right distal superficial femoral artery consistent with >50% stenosis. No hemodynamically significant stenoses are identified in the left lower extremity arterial system. Diffuse disease bilateral lower extremities with biphasic waveform. This exam  reveals moderately decreased perfusion of the lower extremity, noted at the post tibial artery level (ABI).  Bilateral ABI 0.75 with mildly abnormal biphasic waveform at the ankles. Compared to 03/19/17, right SFA stenosis is new and ABI decreased from 0.94.  Echocardiogram 09/14/2018: Normal LV systolic function with EF 55%. Left ventricle cavity is normal in size. Mild concentric hypertrophy of the left ventricle. Normal global wall motion. Grade I diastolic dysfunction. Calculated EF 55%. Septal motion is paradoxical. Frequent PVCs noted during the test.  Left atrial cavity is mildly dilated. Aneurysmal interatrial septum without 2D or color Doppler evidence of interatrial shunt. Mild (Grade I) mitral regurgitation. Normal right atrial pressure.  Compared to previous study in 2016, PVC new.    Peripheral arteriogram 11/03/2018: Abdominal aortogram revealing mild atherosclerotic changes, 2 renal arteries (widely patent.  Left common iliac artery has a focal 70 to 75%, approximately followed by a 30 to 40% stenosis in the left external iliac artery.  Left SFA in the mid segment has a 50 to 60% stenosis in a tandem fashion.  Left popliteal artery is diffusely diseased but patent with less than 20 to 30% stenosis.  Left anterior tibial) left peroneal is occluded, TP trunk is severely diseased around 80%, one-vessel runoff in the form of PT in the left leg below the knee. Right common iliac, external iliac, CFA are widely patent with mild disease.  Right mid SFA 75% stenosis, right DP (and severely disease with 80% diffuse disease, right AT and peroneal artery are occluded, single-vessel involving the form of PT in the right leg below the knee. Successful atherectomy with HawkOne LS right mid to distal SFA stenosis reduced from 25% to 0% with sluggish flow improved to brisk flow.   12/15/2018: Lt CIA: 90% ulcerates lesion. Distal Lt CIA: 50% lesion Mid to distal SFA: Calcific 60& tandem lesions,  hemodynamically non-significant (no pressure gradient). Severe below the knee disease with one vessel runoff PT PTA and balloon expanding stent Omnilink 10.0 X 59 mm placement Left common iliac artery   ABI 12/18/2018: Moderately abnormal spectral waveforms of the right ankle. Mildly abnormal PVR waveforms of the right ankle. Moderately abnormal spectral waveforms of the left ankle. Mildly abnormal PVR waveforms of the left ankle.  Resting ABI 1.00 of the right lower extremity is within normal limits. Mildly decreased left resting ABI 0.96.  Compared to 09/14/2018, bilateral ABI improved from 0.75. Represents successful revascularization of bilateral lower extremities. Recheck in 3 months for patency.   Assessment     ICD-10-CM   1. Claudication in peripheral vascular disease (HCC)  I73.9   2. Hypercholesteremia  E78.00   3. Essential hypertension  I10    EKG 08/11/2018: Normal sinus rhythm at the rate of 97 bpm, left atrial enlargement, rightward axis.  Incomplete right bundle branch block.  Low-voltage complexes.  Pulmonary disease pattern.   2 PVCs, multiform.  EKG 02/17/2017: Normal sinus rhythm at rate of 76 bpm, borderline criteria for left atrial enlargement, left axis deviation.  Poor R-wave progression, cannot exclude anteroseptal infarct old.  Normal QT interval.  No evidence of ischemia. No significant change from EKG 02/21/2016.  Recommendations:   Patient presents here for follow-up of bilateral lower extremity arterial angioplasty, symptoms of claudication has improved significantly.  States that he is not limited by claudication any further.  He has not had any complications.  Groin site severely well.  I reviewed the ABI, there is been a significant improvement in ABI although on physical exam pedal pulses are very difficult to feel.  He will need surveillance Dopplers/ABI in 3 months and  I would like to see him back then.  Lipids are well controlled, blood pressure is also  well controlled.  He is on appropriate medical therapy. Will discontinue Pletal on his next ov and continue Plavix and ASA on hisl next OV in 3 months.   He is having colonoscopy on 01/23/19, stop Pletal and plavix 5 days before.  He is being screened for [facility] (Lp(a) trial)  Adrian Prows, MD, [facility] 12/31/2018, 12:36 PM [facility] [place] Pager: [phone] Office: [phone] If no answer Cell [phone]

## 2019-01-04 ENCOUNTER — Encounter: Payer: Self-pay | Admitting: Physician Assistant

## 2019-01-04 ENCOUNTER — Ambulatory Visit (INDEPENDENT_AMBULATORY_CARE_PROVIDER_SITE_OTHER): Payer: Medicare Other | Admitting: Physician Assistant

## 2019-01-04 DIAGNOSIS — M1712 Unilateral primary osteoarthritis, left knee: Secondary | ICD-10-CM

## 2019-01-04 MED ORDER — HYLAN G-F 20 48 MG/6ML IX SOSY
48.0000 mg | PREFILLED_SYRINGE | INTRA_ARTICULAR | Status: AC | PRN
  Administered 2019-01-04: 48 mg via INTRA_ARTICULAR

## 2019-01-04 MED ORDER — LIDOCAINE HCL 1 % IJ SOLN
0.5000 mL | INTRAMUSCULAR | Status: AC | PRN
  Administered 2019-01-04: .5 mL

## 2019-01-04 NOTE — Progress Notes (Signed)
   Procedure Note  Patient: Jeremy Keller             Date of Birth: Oct 02, 1940           MRN: [id number]             Visit Date: 01/04/2019 HPI: Mr. Jeremy Keller returns today for Synvisc 1 injection left knee.  He has known osteoarthritis left knee.  He has had no injury to the knee.  Physical exam: Left knee full extension full flexion.  No effusion abnormal warmth erythema.  Procedures: Visit Diagnoses:  1. Primary osteoarthritis of left knee     Large Joint Inj: L knee on 01/04/2019 10:09 AM Indications: pain Details: 22 G 1.5 in needle, superolateral approach  Arthrogram: No  Medications: 0.5 mL lidocaine 1 %; 48 mg Hylan 48 MG/6ML Outcome: tolerated well, no immediate complications Procedure, treatment alternatives, risks and benefits explained, specific risks discussed. Consent was given by the patient. Immediately prior to procedure a time out was called to verify the correct patient, procedure, equipment, support staff and site/side marked as required. Patient was prepped and draped in the usual sterile fashion.     Plan: He will follow-up with [place] on as-needed basis.  Explained to knee most likely will get moved relief from the Synvisc 1 injection for 4 to 6 weeks status post injection.  He can resume normal activities on Wednesday.   He will follow-up with [place] on as-needed basis.  Questions encouraged and answered

## 2019-01-08 ENCOUNTER — Other Ambulatory Visit: Payer: Self-pay

## 2019-01-08 MED ORDER — CLOPIDOGREL BISULFATE 75 MG PO TABS: 75.0000 mg | ORAL_TABLET | Freq: Every day | ORAL | 1 refills | Status: DC

## 2019-01-18 ENCOUNTER — Other Ambulatory Visit: Payer: Medicare Other

## 2019-02-07 ENCOUNTER — Emergency Department (HOSPITAL_COMMUNITY)
Admission: EM | Admit: 2019-02-07 | Discharge: 2019-02-07 | Disposition: A | Discharge status: Home / Self Care | Payer: Medicare Other | Attending: Emergency Medicine | Admitting: Emergency Medicine

## 2019-02-07 ENCOUNTER — Ambulatory Visit (HOSPITAL_COMMUNITY)
Admission: EM | Admit: 2019-02-07 | Discharge: 2019-02-07 | Disposition: A | Discharge status: Home / Self Care | Payer: Medicare Other | Source: Home / Self Care

## 2019-02-07 ENCOUNTER — Encounter (HOSPITAL_COMMUNITY): Payer: Self-pay

## 2019-02-07 ENCOUNTER — Emergency Department (HOSPITAL_COMMUNITY): Payer: Medicare Other

## 2019-02-07 ENCOUNTER — Other Ambulatory Visit: Payer: Self-pay

## 2019-02-07 DIAGNOSIS — Y93I9 Activity, other involving external motion: Secondary | ICD-10-CM | POA: Insufficient documentation

## 2019-02-07 DIAGNOSIS — Z951 Presence of aortocoronary bypass graft: Secondary | ICD-10-CM | POA: Insufficient documentation

## 2019-02-07 DIAGNOSIS — Z79899 Other long term (current) drug therapy: Secondary | ICD-10-CM | POA: Insufficient documentation

## 2019-02-07 DIAGNOSIS — Z7982 Long term (current) use of aspirin: Secondary | ICD-10-CM | POA: Insufficient documentation

## 2019-02-07 DIAGNOSIS — Z87891 Personal history of nicotine dependence: Secondary | ICD-10-CM | POA: Insufficient documentation

## 2019-02-07 DIAGNOSIS — E119 Type 2 diabetes mellitus without complications: Secondary | ICD-10-CM | POA: Insufficient documentation

## 2019-02-07 DIAGNOSIS — S161XXA Strain of muscle, fascia and tendon at neck level, initial encounter: Secondary | ICD-10-CM

## 2019-02-07 DIAGNOSIS — Y92410 Unspecified street and highway as the place of occurrence of the external cause: Secondary | ICD-10-CM | POA: Insufficient documentation

## 2019-02-07 DIAGNOSIS — S060X0A Concussion without loss of consciousness, initial encounter: Secondary | ICD-10-CM | POA: Insufficient documentation

## 2019-02-07 DIAGNOSIS — I2510 Atherosclerotic heart disease of native coronary artery without angina pectoris: Secondary | ICD-10-CM | POA: Insufficient documentation

## 2019-02-07 DIAGNOSIS — Y999 Unspecified external cause status: Secondary | ICD-10-CM | POA: Diagnosis not present

## 2019-02-07 DIAGNOSIS — I10 Essential (primary) hypertension: Secondary | ICD-10-CM | POA: Insufficient documentation

## 2019-02-07 DIAGNOSIS — S0990XA Unspecified injury of head, initial encounter: Secondary | ICD-10-CM | POA: Diagnosis present

## 2019-02-07 MED ORDER — METHOCARBAMOL 500 MG PO TABS: 500.0000 mg | ORAL_TABLET | Freq: Three times a day (TID) | ORAL | 0 refills | Status: DC | PRN
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
## 2019-02-07 NOTE — ED Notes (Signed)
Patient verbalizes understanding of discharge instructions. Opportunity for questioning and answers were provided. Armband removed by staff, pt discharged from ED ambulatory to home.  
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
## 2019-03-09 ENCOUNTER — Ambulatory Visit (INDEPENDENT_AMBULATORY_CARE_PROVIDER_SITE_OTHER): Payer: Medicare Other

## 2019-03-09 ENCOUNTER — Other Ambulatory Visit: Payer: Self-pay

## 2019-03-09 DIAGNOSIS — I739 Peripheral vascular disease, unspecified: Secondary | ICD-10-CM | POA: Diagnosis not present

## 2019-03-17 ENCOUNTER — Telehealth: Payer: Self-pay

## 2019-03-17 NOTE — Telephone Encounter (Signed)
-----   Message from Adrian Prows, MD sent at 03/16/2019 12:40 PM EST ----- Mild decrease in left leg circulation,. Will continue to observe unless he is having recurrent symptoms then repeat angiogram, sometimes we fine a small blockage to have come back and relatively easy to fix. If no symptoms, plan to recheck in 3 months. He needs to keep his Jan appointment

## 2019-03-22 ENCOUNTER — Other Ambulatory Visit: Payer: Medicare Other

## 2019-04-05 ENCOUNTER — Other Ambulatory Visit: Payer: Self-pay

## 2019-04-05 ENCOUNTER — Encounter: Payer: Self-pay | Admitting: Cardiology

## 2019-04-05 ENCOUNTER — Ambulatory Visit (INDEPENDENT_AMBULATORY_CARE_PROVIDER_SITE_OTHER): Payer: Medicare Other | Admitting: Cardiology

## 2019-04-05 VITALS — BP 140/90 | HR 70 | Temp 97.2°F | Resp 16 | Ht 72.0 in | Wt 197.8 lb

## 2019-04-05 DIAGNOSIS — I2510 Atherosclerotic heart disease of native coronary artery without angina pectoris: Secondary | ICD-10-CM | POA: Diagnosis not present

## 2019-04-05 DIAGNOSIS — I739 Peripheral vascular disease, unspecified: Secondary | ICD-10-CM

## 2019-04-05 DIAGNOSIS — I10 Essential (primary) hypertension: Secondary | ICD-10-CM | POA: Diagnosis not present

## 2019-04-05 MED ORDER — LABETALOL HCL 100 MG PO TABS: 200.0000 mg | ORAL_TABLET | Freq: Two times a day (BID) | ORAL | 2 refills | Status: DC

## 2019-04-05 NOTE — Progress Notes (Signed)
Primary Physician/Referring:  Wenda Low, MD  Patient ID: Jeremy Keller, male    DOB: April 14, 1940, 79 y.o.   MRN: [id number]  Chief Complaint  Patient presents with  . Follow-up    Claudication of Peripheral Vascular Disease    HPI: Jeremy Keller  is a 79 y.o. male  with coronary artery disease and angioplasty and stenting to his circumflex coronary artery in 2009,  prior tobacco use disorder, he quit smoking in 2007, probably 10-pack-year history, hypertension, DM, hyperlipidemia and hyperglycemia. Due to PAD with claudication underwent staged intervention to right mid and distal SFA using Hawk One directional atherectomy on 11/03/2018 and also left common iliac artery stenting on 12/15/2018.  He now presents for follow-up.  States that symptoms of claudication has returned especially left calf and feels tight with walking.  He underwent surveillance ABI and presents for follow-up.  Denies any recent angina pectoris, No dyspnea, PND or orthopnea.   Past Medical History:  Diagnosis Date  . Coronary artery disease   . Diabetes mellitus without complication ([place])   . Gout   . Myocardial infarct [facility])     Past Surgical History:  Procedure Laterality Date  . CARDIAC CATHETERIZATION    . COLONOSCOPY    . LOWER EXTREMITY ANGIOGRAPHY N/A 11/03/2018   Procedure: LOWER EXTREMITY ANGIOGRAPHY;  Surgeon: Adrian Prows, MD;  Location: [place] [facility];  Service: Cardiovascular;  Laterality: N/A;  . LOWER EXTREMITY ANGIOGRAPHY Left 12/15/2018   Procedure: LOWER EXTREMITY ANGIOGRAPHY;  Surgeon: Nigel Mormon, MD;  Location: [place] [facility];  Service: Cardiovascular;  Laterality: Left;  . PERIPHERAL VASCULAR ATHERECTOMY  11/03/2018   Procedure: PERIPHERAL VASCULAR ATHERECTOMY;  Surgeon: Adrian Prows, MD;  Location: [place] [facility];  Service: Cardiovascular;;  . PERIPHERAL VASCULAR BALLOON ANGIOPLASTY  11/03/2018   Procedure: PERIPHERAL VASCULAR BALLOON ANGIOPLASTY;  Surgeon: Adrian Prows, MD;   Location: [place] [facility];  Service: Cardiovascular;;  . PERIPHERAL VASCULAR INTERVENTION  12/15/2018   Procedure: PERIPHERAL VASCULAR INTERVENTION;  Surgeon: Nigel Mormon, MD;  Location: [place] [facility];  Service: Cardiovascular;;  left common iliac    Social History   Socioeconomic History  . Marital status: Divorced    Spouse name: Not on file  . Number of children: 14  . Years of education: Not on file  . Highest education level: Not on file  Occupational History  . Not on file  Tobacco Use  . Smoking status: Former Smoker    Packs/day: 1.00    Years: 12.00    Pack years: 12.00    Types: Cigarettes    Quit date: 2007    Years since quitting: 14.0  . Smokeless tobacco: Never Used  Substance and Sexual Activity  . Alcohol use: Yes    Comment: occ  . Drug use: No  . Sexual activity: Not on file  Other Topics Concern  . Not on file  Social History Narrative  . Not on file   Social Determinants of Health   Financial Resource Strain:   . Difficulty of Paying Living Expenses: Not on file  Food Insecurity:   . Worried About Charity fundraiser in the Last Year: Not on file  . Ran Out of Food in the Last Year: Not on file  Transportation Needs:   . Lack of Transportation (Medical): Not on file  . Lack of Transportation (Non-Medical): Not on file  Physical Activity:   . Days of Exercise per Week: Not on  file  . Minutes of Exercise per Session: Not on file  Stress:   . Feeling of Stress : Not on file  Social Connections:   . Frequency of Communication with Friends and Family: Not on file  . Frequency of Social Gatherings with Friends and Family: Not on file  . Attends Religious Services: Not on file  . Active Member of Clubs or Organizations: Not on file  . Attends Archivist Meetings: Not on file  . Marital Status: Not on file  Intimate Partner Violence:   . Fear of Current or Ex-Partner: Not on file  . Emotionally Abused: Not on file  .  Physically Abused: Not on file  . Sexually Abused: Not on file   Review of Systems  Constitution: Negative for chills, decreased appetite, malaise/fatigue and weight gain.  Cardiovascular: Positive for claudication (left calf). Negative for dyspnea on exertion, leg swelling, palpitations and syncope.  Endocrine: Negative for cold intolerance.  Hematologic/Lymphatic: Does not bruise/bleed easily.  Musculoskeletal: Positive for joint pain (bilateral thumbs). Negative for joint swelling and muscle cramps.  Gastrointestinal: Negative for abdominal pain, anorexia and change in bowel habit.  Genitourinary: Positive for decreased libido (ED).  Neurological: Negative for headaches and light-headedness.  Psychiatric/Behavioral: Negative for depression and substance abuse.  All other systems reviewed and are negative.   Objective  Blood pressure 140/90, pulse 70, temperature (!) 97.2 F (36.2 C), temperature source Temporal, resp. rate 16, height 6' (1.829 m), weight 197 lb 12.8 oz (89.7 kg), SpO2 94 %. Body mass index is 26.83 kg/m. Vitals with BMI 04/05/2019 02/07/2019 02/07/2019  Height '6\' 0"'$  - -  Weight 197 lbs 13 oz - -  BMI 70.78 - -  Systolic 675 449 201  Diastolic 90 85 91  Pulse 70 70 71      Physical Exam  Constitutional: He appears well-developed and well-nourished. No distress.  HENT:  Head: Atraumatic.  Eyes: Conjunctivae are normal.  Neck: No JVD present. No thyromegaly present.  Cardiovascular: Normal rate, regular rhythm and normal heart sounds. Exam reveals decreased pulses. Exam reveals no gallop.  No murmur heard. Pulses:      Carotid pulses are 2+ on the right side and 2+ on the left side.      Femoral pulses are 2+ on the right side with bruit and 2+ on the left side with bruit.      Popliteal pulses are 2+ on the right side and 0 on the left side.       Dorsalis pedis pulses are 0 on the right side and 0 on the left side.       Posterior tibial pulses are 0 on the  right side and 0 on the left side.  No edema. Capillary refill normal.   Pulmonary/Chest: Effort normal and breath sounds normal.  Abdominal: Soft. Bowel sounds are normal.  Ventral hernia noted. Reducible  Musculoskeletal:        General: Normal range of motion.     Cervical back: Neck supple.  Neurological: He is alert.  Skin: Skin is warm and dry.  Psychiatric: He has a normal mood and affect.     Radiology: No results found.  Laboratory examination:   CMP Latest Ref Rng & Units 12/15/2018 10/16/2018 09/22/2014  Glucose 70 - 99 mg/dL 128(H) 106(H) 113(H)  BUN 8 - 23 mg/dL '8 10 19  '$ Creatinine 0.61 - 1.24 mg/dL 0.96 1.05 1.20  Sodium 135 - 145 mmol/L 140 143 136  Potassium 3.5 -  5.1 mmol/L 3.7 4.6 4.1  Chloride 98 - 111 mmol/L 104 103 105  CO2 22 - 32 mmol/L '25 24 22  '$ Calcium 8.9 - 10.3 mg/dL 9.1 9.9 9.0  Total Protein 6.0 - 8.5 g/dL - 7.2 -  Total Bilirubin 0.0 - 1.2 mg/dL - 0.6 -  Alkaline Phos 39 - 117 IU/L - 73 -  AST 0 - 40 IU/L - 28 -  ALT 0 - 44 IU/L - 26 -   CBC Latest Ref Rng & Units 12/15/2018 10/16/2018 09/22/2014  WBC 4.0 - 10.5 K/uL 6.1 8.2 6.6  Hemoglobin 13.0 - 17.0 g/dL 15.0 15.8 14.4  Hematocrit 39.0 - 52.0 % 44.8 46.0 41.5  Platelets 150 - 400 K/uL 217 232 211   Lipid Panel     Component Value Date/Time   CHOL 128 10/16/2018 1017   TRIG 83 10/16/2018 1017   HDL 60 10/16/2018 1017   CHOLHDL 2.9 07/10/2007 0355   VLDL 20 07/10/2007 0355   LDLCALC 51 10/16/2018 1017   HEMOGLOBIN A1C Lab Results  Component Value Date   HGBA1C 6.3 (H) 10/16/2018   MPG 147 07/09/2007   TSH Recent Labs    10/16/18 1017  TSH 1.570    Labs 11/16/2018: A1c 6.2%, CBC normal, serum glucose 76 minute grams, BUN 12, creatinine 0.9 and, eGFR greater than 60 mL.  PSA mildly elevated at 5.40.  Total cholesterol 127, triglycerides 110, HDL 54, LDL 51.  Non-HDL cholesterol 73.   PRN Meds:. There are no discontinued medications. Current Meds  Medication Sig  . allopurinol  (ZYLOPRIM) 100 MG tablet Take 100 mg by mouth 2 (two) times daily.  Marland Kitchen amLODipine (NORVASC) 10 MG tablet Take 0.5 tablets (5 mg total) by mouth daily. (Patient taking differently: Take 10 mg by mouth daily. )  . aspirin EC 81 MG tablet Take 162 mg by mouth daily.   Marland Kitchen atorvastatin (LIPITOR) 20 MG tablet Take 20 mg by mouth daily.   . cilostazol (PLETAL) 50 MG tablet TAKE 1 TABLET(50 MG) BY MOUTH TWICE DAILY (Patient taking differently: Take 50 mg by mouth 2 (two) times daily. )  . clopidogrel (PLAVIX) 75 MG tablet Take 1 tablet (75 mg total) by mouth daily.  Marland Kitchen gabapentin (NEURONTIN) 100 MG capsule Take 100 mg by mouth at bedtime.   Marland Kitchen glimepiride (AMARYL) 1 MG tablet Take 1 mg by mouth daily with breakfast.  . losartan (COZAAR) 100 MG tablet Take 100 mg by mouth daily.  . methocarbamol (ROBAXIN) 500 MG tablet Take 1 tablet (500 mg total) by mouth every 8 (eight) hours as needed for muscle spasms.  . pantoprazole (PROTONIX) 40 MG tablet Take 40 mg by mouth daily.    Cardiac Studies:   Abdominal aortic duplex 03/19/2017: The maximum aorta diameter is 2.76 cm (dist). Diffuse plaque observed in the mid and distal aorta. Normal flow velocities noted in the aorta and iliac arteries. No AAA noted.  Coronary angiogram 07/09/2007: Stenting of OM branch of circumflex with 3.0 x 15 mm Promus DES.  Left main ostial 20%, mid LAD 50% stenosis.  Diagonal 2, large has mid 90% stenosis.  Exercise sestamibi stress test 11/07/2014: 1. Resting EKG demonstrates normal sinus rhythm, stress EKG was negative for myocardial ischemia.  Occasional PVCs with stress EKG. The patient performed treadmill exercise using a Bruce protocol, completing 5:01 minutes. The patient completed an estimated workload of 7.03 METS, 100% of the maximum predicted heart rate. Stress symptoms included dyspnea. 2. The perfusion imaging study demonstrates  a very small-sized apical inferolateral and a small sized apical-lateral ischemia of mild  intensity.  Left ventricle systolic function calculated by QGS was mildly depressed at 49%. Overall the defect is small sized and mild in intensity, low risk stress test.  The defect probably correlates with the diagonal stenosis and may also represent in-stent restenosis in the circumflex coronary artery.  Clinical correlation is recommended.  Lower Extremity Arterial Duplex 09/14/2018: Moderate velocity increase at the right distal superficial femoral artery consistent with >50% stenosis. No hemodynamically significant stenoses are identified in the left lower extremity arterial system. Diffuse disease bilateral lower extremities with biphasic waveform. This exam reveals moderately decreased perfusion of the lower extremity, noted at the post tibial artery level (ABI). Bilateral ABI 0.75 with mildly abnormal biphasic waveform at the ankles. Compared to 03/19/17, right SFA stenosis is new and ABI decreased from 0.94.  Echocardiogram 09/14/2018: Normal LV systolic function with EF 55%. Left ventricle cavity is normal in size. Mild concentric hypertrophy of the left ventricle. Normal global wall motion. Grade I diastolic dysfunction. Calculated EF 55%. Septal motion is paradoxical. Frequent PVCs noted during the test.  Left atrial cavity is mildly dilated. Aneurysmal interatrial septum without 2D or color Doppler evidence of interatrial shunt. Mild (Grade I) mitral regurgitation. Normal right atrial pressure.  Compared to previous study in 2016, PVC new.    Peripheral arteriogram 11/03/2018: Abdominal aortogram revealing mild atherosclerotic changes, 2 renal arteries (widely patent.  Left common iliac artery has a focal 70 to 75%, approximately followed by a 30 to 40% stenosis in the left external iliac artery.  Left SFA in the mid segment has a 50 to 60% stenosis in a tandem fashion.  Left popliteal artery is diffusely diseased but patent with less than 20 to 30% stenosis.  Left anterior tibial)  left peroneal is occluded, TP trunk is severely diseased around 80%, one-vessel runoff in the form of PT in the left leg below the knee. Right common iliac, external iliac, CFA are widely patent with mild disease.  Right mid SFA 75% stenosis, right DP (and severely disease with 80% diffuse disease, right AT and peroneal artery are occluded, single-vessel involving the form of PT in the right leg below the knee. Successful atherectomy with HawkOne LS right mid to distal SFA stenosis reduced from 25% to 0% with sluggish flow improved to brisk flow.  12/15/2018: Lt CIA: 90% ulcerates lesion. Distal Lt CIA: 50% lesion Mid to distal SFA: Calcific 60& tandem lesions, hemodynamically non-significant (no pressure gradient). Severe below the knee disease with one vessel runoff PT PTA and balloon expanding stent Omnilink 10.0 X 59 mm placement Left common iliac artery   ABI 03/09/2019: This exam reveals mildly decreased perfusion of the right lower extremity, noted at the post tibial artery level (ABI 0.96) and moderately decreased perfusion of the left lower extremity, noted at the anterior tibial artery level (ABI 0.79).  Compared to 12/18/2018, left ABI has decreased back to baseline from 0.96. May suggest restenosis in the prior angioplasty site.  Clinical correlation recommended. Recheck in 3 months. 09/14/2018, bilateral ABI 0.75 - pre-procedure.   Assessment     ICD-10-CM   1. Claudication in peripheral vascular disease (HCC)  I73.9 PCV LOWER ARTERIAL (BILATERAL)  2. Essential hypertension  I10 EKG 12-Lead    labetalol (NORMODYNE) 100 MG tablet  3. Coronary artery disease involving native coronary artery of native heart without angina pectoris  I25.10    EKG 04/05/2019: Normal sinus rhythm at rate  of 69 bpm, left axis deviation, left plantar fascicular block.  Poor progression, pulmonary disease pattern.  Nonspecific T-wave flattening. No significant change from  EKG 08/11/2018.     Recommendations:   Jeremy Keller  is a 79 y.o. AA male  with coronary artery disease and angioplasty and stenting to his circumflex coronary artery in 2009,  prior tobacco use disorder, he quit smoking in 2007, probably 10-pack-year history, hypertension, DM, hyperlipidemia and hyperglycemia. Due to PAD with claudication underwent staged intervention to right mid and distal SFA using Hawk One directional atherectomy on 11/03/2018 and also left common iliac artery stenting on 12/15/2018.  He now has recurrence of symptoms of claudication involving the left leg, he does have good femoral pulse, suspect whether there is progression of disease left SFA.  I will obtain lower extremity arterial duplex in 3 months and I would like to see him back.  I would have a low threshold to perform angiography in view of significant symptoms, I suspect he probably may have focal re-stenosis in the left iliac artery stent or there may be progression the left SFA tandem stenosis he had previously.  He is also small vessel disease diffusely.  There is no sense of critical limb ischemia. Discontinue Pletal as he is presently on Plavix and aspirin to reduce the risk of bleeding.  With regard to coronary artery disease, no recurrence of angina pectoris.  Lipids are well controlled, blood pressure is elevated today I have added labetalol 100 mg p.o. b.i.d. Office visit in 3 months.Adrian Prows, MD, [facility] 04/05/2019, 1:09 PM [facility] [place] Pager: [phone] Office: [phone] If no answer Cell [phone]

## 2019-05-22 ENCOUNTER — Other Ambulatory Visit: Payer: Self-pay | Admitting: Cardiology

## 2019-05-22 DIAGNOSIS — I10 Essential (primary) hypertension: Secondary | ICD-10-CM

## 2019-06-28 ENCOUNTER — Ambulatory Visit: Payer: Medicare Other

## 2019-06-28 ENCOUNTER — Other Ambulatory Visit: Payer: Self-pay

## 2019-06-28 DIAGNOSIS — I739 Peripheral vascular disease, unspecified: Secondary | ICD-10-CM

## 2019-06-30 ENCOUNTER — Other Ambulatory Visit: Payer: Self-pay | Admitting: Urology

## 2019-06-30 DIAGNOSIS — N50811 Right testicular pain: Secondary | ICD-10-CM

## 2019-07-05 ENCOUNTER — Encounter: Payer: Self-pay | Admitting: Cardiology

## 2019-07-05 ENCOUNTER — Other Ambulatory Visit: Payer: Self-pay

## 2019-07-05 ENCOUNTER — Ambulatory Visit: Payer: Medicare Other | Admitting: Cardiology

## 2019-07-05 ENCOUNTER — Telehealth: Payer: Self-pay

## 2019-07-05 VITALS — BP 130/75 | HR 57 | Temp 97.3°F | Resp 16 | Ht 72.0 in | Wt 201.1 lb

## 2019-07-05 DIAGNOSIS — I2510 Atherosclerotic heart disease of native coronary artery without angina pectoris: Secondary | ICD-10-CM

## 2019-07-05 DIAGNOSIS — I10 Essential (primary) hypertension: Secondary | ICD-10-CM

## 2019-07-05 DIAGNOSIS — I739 Peripheral vascular disease, unspecified: Secondary | ICD-10-CM

## 2019-07-05 DIAGNOSIS — E7800 Pure hypercholesterolemia, unspecified: Secondary | ICD-10-CM

## 2019-07-05 DIAGNOSIS — N5201 Erectile dysfunction due to arterial insufficiency: Secondary | ICD-10-CM

## 2019-07-05 MED ORDER — SILDENAFIL CITRATE 100 MG PO TABS: 100.0000 mg | ORAL_TABLET | Freq: Every day | ORAL | 6 refills | Status: DC | PRN

## 2019-07-05 NOTE — Telephone Encounter (Signed)
No worries, I mentioned it to him that we will repeat the study in 6 months prior to Visit. You can call and let him know, if how ever he does not wish, I will see him and then order. JG

## 2019-07-05 NOTE — Progress Notes (Signed)
Primary Physician/Referring:  Wenda Low, MD  Patient ID: Jeremy Keller, male    DOB: [birth year], 79 y.o.   MRN: [id number]  Chief Complaint  Patient presents with  . PAD  . Hypertension    3 month f/u    HPI: Gerry L Boley  is a 79 y.o. male  with coronary artery disease and angioplasty and stenting to his circumflex coronary artery in 2009,  prior tobacco use disorder, he quit smoking in 2007, probably 10-pack-year history, hypertension, DM, hyperlipidemia and hyperglycemia. Due to PAD with claudication underwent staged intervention to right mid and distal SFA using Hawk One directional atherectomy on 11/03/2018 and also left common iliac artery stenting on 12/15/2018.    On his last office visit had added labetalol which he is tolerating.  Continues to have heaviness in his left calf with activity but states that it is slightly limiting.  He continues to remain fairly active.  No ulceration or bruise discoloration.  He has not had any angina pectoris or dyspnea on exertion.  Past Medical History:  Diagnosis Date  . Coronary artery disease   . Diabetes mellitus without complication ([place])   . Gout   . Myocardial infarct [facility])     Past Surgical History:  Procedure Laterality Date  . CARDIAC CATHETERIZATION    . COLONOSCOPY    . LOWER EXTREMITY ANGIOGRAPHY N/A 11/03/2018   Procedure: LOWER EXTREMITY ANGIOGRAPHY;  Surgeon: Adrian Prows, MD;  Location: [place] [facility];  Service: Cardiovascular;  Laterality: N/A;  . LOWER EXTREMITY ANGIOGRAPHY Left 12/15/2018   Procedure: LOWER EXTREMITY ANGIOGRAPHY;  Surgeon: Nigel Mormon, MD;  Location: [place] [facility];  Service: Cardiovascular;  Laterality: Left;  . PERIPHERAL VASCULAR ATHERECTOMY  11/03/2018   Procedure: PERIPHERAL VASCULAR ATHERECTOMY;  Surgeon: Adrian Prows, MD;  Location: [place] [facility];  Service: Cardiovascular;;  . PERIPHERAL VASCULAR BALLOON ANGIOPLASTY  11/03/2018   Procedure: PERIPHERAL VASCULAR BALLOON  ANGIOPLASTY;  Surgeon: Adrian Prows, MD;  Location: [place] [facility];  Service: Cardiovascular;;  . PERIPHERAL VASCULAR INTERVENTION  12/15/2018   Procedure: PERIPHERAL VASCULAR INTERVENTION;  Surgeon: Nigel Mormon, MD;  Location: [place] [facility];  Service: Cardiovascular;;  left common iliac    Social History   Socioeconomic History  . Marital status: Divorced    Spouse name: Not on file  . Number of children: 14  . Years of education: Not on file  . Highest education level: Not on file  Occupational History  . Not on file  Tobacco Use  . Smoking status: Former Smoker    Packs/day: 1.00    Years: 12.00    Pack years: 12.00    Types: Cigarettes    Quit date: 2007    Years since quitting: 14.2  . Smokeless tobacco: Never Used  Substance and Sexual Activity  . Alcohol use: Yes    Comment: occ  . Drug use: No  . Sexual activity: Not on file  Other Topics Concern  . Not on file  Social History Narrative  . Not on file   Social Determinants of Health   Financial Resource Strain:   . Difficulty of Paying Living Expenses:   Food Insecurity:   . Worried About Charity fundraiser in the Last Year:   . Arboriculturist in the Last Year:   Transportation Needs:   . Film/video editor (Medical):   Marland Kitchen Lack of Transportation (Non-Medical):   Physical Activity:   . Days  of Exercise per Week:   . Minutes of Exercise per Session:   Stress:   . Feeling of Stress :   Social Connections:   . Frequency of Communication with Friends and Family:   . Frequency of Social Gatherings with Friends and Family:   . Attends Religious Services:   . Active Member of Clubs or Organizations:   . Attends Archivist Meetings:   Marland Kitchen Marital Status:   Intimate Partner Violence:   . Fear of Current or Ex-Partner:   . Emotionally Abused:   Marland Kitchen Physically Abused:   . Sexually Abused:    Review of Systems  Cardiovascular: Positive for claudication. Negative for chest pain,  dyspnea on exertion and leg swelling.  Musculoskeletal: Positive for arthritis and joint pain.  Gastrointestinal: Negative for melena.    Objective  Blood pressure 130/75, pulse (!) 57, temperature (!) 97.3 F (36.3 C), temperature source Temporal, resp. rate 16, height 6' (1.829 m), weight 201 lb 1.6 oz (91.2 kg), SpO2 97 %. Body mass index is 27.27 kg/m. Vitals with BMI 07/05/2019 04/05/2019 02/07/2019  Height '6\' 0"'$  '6\' 0"'$  -  Weight 201 lbs 2 oz 197 lbs 13 oz -  BMI 66.29 47.65 -  Systolic 465 035 465  Diastolic 75 90 85  Pulse 57 70 70      Physical Exam  Constitutional: He appears well-developed and well-nourished. No distress.  Cardiovascular: Normal rate, regular rhythm and normal heart sounds. Exam reveals decreased pulses. Exam reveals no gallop.  No murmur heard. Pulses:      Carotid pulses are 2+ on the right side and 2+ on the left side.      Femoral pulses are 2+ on the right side with bruit and 2+ on the left side with bruit.      Popliteal pulses are 2+ on the right side and 0 on the left side.       Dorsalis pedis pulses are 0 on the right side and 0 on the left side.       Posterior tibial pulses are 0 on the right side and 0 on the left side.  No edema.  Capillary refill normal.  No JVD.  Pulmonary/Chest: Effort normal and breath sounds normal.  Abdominal: Soft. Bowel sounds are normal.  Ventral hernia noted. Reducible  Musculoskeletal:     Cervical back: Neck supple.     Radiology: No results found.  Laboratory examination:   CMP Latest Ref Rng & Units 12/15/2018 10/16/2018 09/22/2014  Glucose 70 - 99 mg/dL 128(H) 106(H) 113(H)  BUN 8 - 23 mg/dL '8 10 19  '$ Creatinine 0.61 - 1.24 mg/dL 0.96 1.05 1.20  Sodium 135 - 145 mmol/L 140 143 136  Potassium 3.5 - 5.1 mmol/L 3.7 4.6 4.1  Chloride 98 - 111 mmol/L 104 103 105  CO2 22 - 32 mmol/L '25 24 22  '$ Calcium 8.9 - 10.3 mg/dL 9.1 9.9 9.0  Total Protein 6.0 - 8.5 g/dL - 7.2 -  Total Bilirubin 0.0 - 1.2 mg/dL - 0.6 -    Alkaline Phos 39 - 117 IU/L - 73 -  AST 0 - 40 IU/L - 28 -  ALT 0 - 44 IU/L - 26 -   CBC Latest Ref Rng & Units 12/15/2018 10/16/2018 09/22/2014  WBC 4.0 - 10.5 K/uL 6.1 8.2 6.6  Hemoglobin 13.0 - 17.0 g/dL 15.0 15.8 14.4  Hematocrit 39.0 - 52.0 % 44.8 46.0 41.5  Platelets 150 - 400 K/uL 217 232 211  Lipid Panel     Component Value Date/Time   CHOL 128 10/16/2018 1017   TRIG 83 10/16/2018 1017   HDL 60 10/16/2018 1017   CHOLHDL 2.9 07/10/2007 0355   VLDL 20 07/10/2007 0355   LDLCALC 51 10/16/2018 1017   HEMOGLOBIN A1C Lab Results  Component Value Date   HGBA1C 6.3 (H) 10/16/2018   MPG 147 07/09/2007   TSH Recent Labs    10/16/18 1017  TSH 1.570    External Labs   11/16/2018: A1c 6.2%, CBC normal, serum glucose 76 minute grams, BUN 12, creatinine 0.9 and, eGFR greater than 60 mL.  PSA mildly elevated at 5.40.  Total cholesterol 127, triglycerides 110, HDL 54, LDL 51.  Non-HDL cholesterol 73.   PRN Meds:. There are no discontinued medications. Current Meds  Medication Sig  . allopurinol (ZYLOPRIM) 100 MG tablet Take 100 mg by mouth 2 (two) times daily.  Marland Kitchen amLODipine (NORVASC) 10 MG tablet Take 0.5 tablets (5 mg total) by mouth daily. (Patient taking differently: Take 10 mg by mouth daily. )  . aspirin EC 81 MG tablet Take 162 mg by mouth daily.   Marland Kitchen atorvastatin (LIPITOR) 20 MG tablet Take 20 mg by mouth daily.   . cilostazol (PLETAL) 50 MG tablet TAKE 1 TABLET(50 MG) BY MOUTH TWICE DAILY (Patient taking differently: Take 50 mg by mouth 2 (two) times daily. )  . clopidogrel (PLAVIX) 75 MG tablet Take 1 tablet (75 mg total) by mouth daily.  Marland Kitchen gabapentin (NEURONTIN) 100 MG capsule Take 100 mg by mouth at bedtime.   Marland Kitchen glimepiride (AMARYL) 1 MG tablet Take 1 mg by mouth daily with breakfast.  . indomethacin (INDOCIN) 50 MG capsule Take 50 mg by mouth daily.  Marland Kitchen labetalol (NORMODYNE) 100 MG tablet TAKE 2 TABLETS(200 MG) BY MOUTH TWICE DAILY  . losartan (COZAAR) 100 MG tablet  Take 100 mg by mouth daily.  . methocarbamol (ROBAXIN) 500 MG tablet Take 1 tablet (500 mg total) by mouth every 8 (eight) hours as needed for muscle spasms.  . pantoprazole (PROTONIX) 40 MG tablet Take 40 mg by mouth daily.    Cardiac Studies:   Abdominal aortic duplex 03/19/2017: The maximum aorta diameter is 2.76 cm (dist). Diffuse plaque observed in the mid and distal aorta. Normal flow velocities noted in the aorta and iliac arteries. No AAA noted.  Coronary angiogram 07/09/2007: Stenting of OM branch of circumflex with 3.0 x 15 mm Promus DES.  Left main ostial 20%, mid LAD 50% stenosis.  Diagonal 2, large has mid 90% stenosis.  Exercise sestamibi stress test 11/07/2014: 1. Resting EKG demonstrates normal sinus rhythm, stress EKG was negative for myocardial ischemia.  Occasional PVCs with stress EKG. The patient performed treadmill exercise using a Bruce protocol, completing 5:01 minutes. The patient completed an estimated workload of 7.03 METS, 100% of the maximum predicted heart rate. Stress symptoms included dyspnea. 2. The perfusion imaging study demonstrates a very small-sized apical inferolateral and a small sized apical-lateral ischemia of mild intensity.  Left ventricle systolic function calculated by QGS was mildly depressed at 49%. Overall the defect is small sized and mild in intensity, low risk stress test.  The defect probably correlates with the diagonal stenosis and may also represent in-stent restenosis in the circumflex coronary artery.  Clinical correlation is recommended.  Lower Extremity Arterial Duplex 09/14/2018: Moderate velocity increase at the right distal superficial femoral artery consistent with >50% stenosis. No hemodynamically significant stenoses are identified in the left lower extremity arterial  system. Diffuse disease bilateral lower extremities with biphasic waveform. This exam reveals moderately decreased perfusion of the lower extremity, noted at the post  tibial artery level (ABI). Bilateral ABI 0.75 with mildly abnormal biphasic waveform at the ankles. Compared to 03/19/17, right SFA stenosis is new and ABI decreased from 0.94.  Echocardiogram 09/14/2018: Normal LV systolic function with EF 55%. Left ventricle cavity is normal in size. Mild concentric hypertrophy of the left ventricle. Normal global wall motion. Grade I diastolic dysfunction. Calculated EF 55%. Septal motion is paradoxical. Frequent PVCs noted during the test.  Left atrial cavity is mildly dilated. Aneurysmal interatrial septum without 2D or color Doppler evidence of interatrial shunt. Mild (Grade I) mitral regurgitation. Normal right atrial pressure.  Compared to previous study in 2016, PVC new.    Peripheral arteriogram    11/03/2018: Abdominal aortogram revealing mild atherosclerotic changes, 2 renal arteries (widely patent.  Left common iliac artery has a focal 70 to 75%, approximately followed by a 30 to 40% stenosis in the left external iliac artery.  Left SFA in the mid segment has a 50 to 60% stenosis in a tandem fashion.  Left popliteal artery is diffusely diseased but patent with less than 20 to 30% stenosis.  Left anterior tibial) left peroneal is occluded, TP trunk is severely diseased around 80%, one-vessel runoff in the form of PT in the left leg below the knee. Right common iliac, external iliac, CFA are widely patent with mild disease.  Right mid SFA 75% stenosis, right DP (and severely disease with 80% diffuse disease, right AT and peroneal artery are occluded, single-vessel involving the form of PT in the right leg below the knee. Successful atherectomy with HawkOne LS right mid to distal SFA stenosis reduced from 25% to 0% with sluggish flow improved to brisk flow.   12/15/2018: Lt CIA: 90% ulcerates lesion. Distal Lt CIA: 50% lesion Mid to distal SFA: Calcific 60& tandem lesions, hemodynamically non-significant (no pressure gradient). Severe below the knee  disease with one vessel runoff PT PTA and balloon expanding stent Omnilink 10.0 X 59 mm placement Left common iliac artery   ABI 03/09/2019: This exam reveals mildly decreased perfusion of the right lower extremity, noted at the post tibial artery level (ABI 0.96) and moderately decreased perfusion of the left lower extremity, noted at the anterior tibial artery level (ABI 0.79).  Compared to 12/18/2018, left ABI has decreased back to baseline from 0.96. May suggest restenosis in the prior angioplasty site.  Clinical correlation recommended. Recheck in 3 months. 09/14/2018, bilateral ABI 0.75 - pre-procedure.   Lower Extremity Arterial Duplex 06/28/2019:  No hemodynamically significant stenoses are identified in the right and  lower extremity arterial system. No hemodynamically significant stenoses are identified in the left lower extremity arterial system.  The left AT has monophasic waveform and suggests occlusion or diffuse  disease.  This exam reveals mildly decreased perfusion of the right lower extremity,  noted at the post tibial artery level (ABI 0.96) and moderately decreased  perfusion of the left lower extremity, noted at the post tibial artery  level (ABI 0.75).  Compared to ABI 03/09/2019, no significant change. Study suggests patent  right SFA and left CIA angioplasty.   Assessment     ICD-10-CM   1. Claudication in peripheral vascular disease (HCC)  I73.9 PCV LOWER ARTERIAL (BILATERAL)  2. Essential hypertension  I10   3. Coronary artery disease involving native coronary artery of native heart without angina pectoris  I25.10   4.  Hypercholesteremia  E78.00   5. Erectile dysfunction due to arterial insufficiency  N52.01 sildenafil (VIAGRA) 100 MG tablet   EKG 04/05/2019: Normal sinus rhythm at rate of 69 bpm, left axis deviation, left plantar fascicular block.  Poor progression, pulmonary disease pattern.  Nonspecific T-wave flattening. No significant change from  EKG 08/11/2018.     Recommendations:   WENDY MIKLES  is a 79 y.o. AA male  with coronary artery disease and angioplasty and stenting to his circumflex coronary artery in 2009,  prior tobacco use disorder, he quit smoking in 2007, probably 10-pack-year history, hypertension, DM, hyperlipidemia and hyperglycemia. Due to PAD with claudication underwent staged intervention to right mid and distal SFA using Hawk One directional atherectomy on 11/03/2018 and also left common iliac artery stenting on 12/15/2018.  He now has recurrence of symptoms of claudication involving the left leg, he does have good femoral pulse, suspect small vessel diabetic disease as etiology and would recommend complete medical therapy unless symptoms become extremely severe or if there is any limb threatening ischemia.  I will repeat low activity arterial duplex in 6 months. Complains of erectile dysfunction, sildenafil 100 mg as needed daily prescription given to the patient.  He is aware of interaction with nitroglycerin.  With regard to coronary artery disease, no recurrence of angina pectoris.  No clinical evidence of heart failure. Lipids are well controlled, blood pressure is now well controlled with addition of labetalol 100 mg p.o. b.i.d. I will see him back in 6 months.   Adrian Prows, MD, [facility] 07/05/2019, 4:00 PM [facility] [place] Pager: [phone] Office: [phone] If no answer Cell [phone]

## 2019-07-06 ENCOUNTER — Ambulatory Visit
Admission: RE | Admit: 2019-07-06 | Discharge: 2019-07-06 | Disposition: A | Discharge status: Home / Self Care | Payer: Medicare Other | Source: Ambulatory Visit | Attending: Urology | Admitting: Urology

## 2019-07-06 DIAGNOSIS — N50811 Right testicular pain: Secondary | ICD-10-CM

## 2019-07-16 ENCOUNTER — Other Ambulatory Visit: Payer: Self-pay | Admitting: Cardiology

## 2019-07-27 ENCOUNTER — Other Ambulatory Visit: Payer: Self-pay | Admitting: Cardiology

## 2019-07-27 DIAGNOSIS — I10 Essential (primary) hypertension: Secondary | ICD-10-CM

## 2019-10-21 ENCOUNTER — Other Ambulatory Visit: Payer: Self-pay | Admitting: Cardiology

## 2019-10-21 DIAGNOSIS — I10 Essential (primary) hypertension: Secondary | ICD-10-CM

## 2019-12-12 ENCOUNTER — Other Ambulatory Visit: Payer: Self-pay | Admitting: Cardiology

## 2019-12-12 DIAGNOSIS — I10 Essential (primary) hypertension: Secondary | ICD-10-CM

## 2019-12-27 ENCOUNTER — Encounter: Payer: Self-pay | Admitting: Cardiology

## 2019-12-29 ENCOUNTER — Other Ambulatory Visit: Payer: Self-pay

## 2019-12-29 ENCOUNTER — Ambulatory Visit: Payer: Medicare Other

## 2019-12-29 DIAGNOSIS — I739 Peripheral vascular disease, unspecified: Secondary | ICD-10-CM

## 2020-01-02 ENCOUNTER — Other Ambulatory Visit: Payer: Self-pay | Admitting: Cardiology

## 2020-01-02 DIAGNOSIS — I10 Essential (primary) hypertension: Secondary | ICD-10-CM

## 2020-01-03 ENCOUNTER — Other Ambulatory Visit: Payer: Self-pay | Admitting: Cardiology

## 2020-01-03 ENCOUNTER — Encounter: Payer: Self-pay | Admitting: Cardiology

## 2020-01-03 ENCOUNTER — Other Ambulatory Visit: Payer: Self-pay

## 2020-01-03 ENCOUNTER — Ambulatory Visit: Payer: Medicare Other | Admitting: Cardiology

## 2020-01-03 VITALS — BP 136/86 | HR 57 | Resp 16 | Ht 72.0 in | Wt 202.0 lb

## 2020-01-03 DIAGNOSIS — I10 Essential (primary) hypertension: Secondary | ICD-10-CM

## 2020-01-03 DIAGNOSIS — B351 Tinea unguium: Secondary | ICD-10-CM

## 2020-01-03 DIAGNOSIS — E7800 Pure hypercholesterolemia, unspecified: Secondary | ICD-10-CM

## 2020-01-03 DIAGNOSIS — I739 Peripheral vascular disease, unspecified: Secondary | ICD-10-CM

## 2020-01-03 DIAGNOSIS — I2510 Atherosclerotic heart disease of native coronary artery without angina pectoris: Secondary | ICD-10-CM

## 2020-01-03 NOTE — Progress Notes (Signed)
Primary Physician/Referring:  Wenda Low, MD  Patient ID: Jeremy Keller, male    DOB: Dec 18, 1940, 79 y.o.   MRN: [id number]  Chief Complaint  Patient presents with   Coronary Artery Disease   PAD   Hypertension   Follow-up    6 month    HPI: Jeremy Keller  is a 79 y.o. male  with coronary artery disease and angioplasty and stenting to his circumflex coronary artery in 2009,  prior tobacco use disorder, he quit smoking in 2007, probably 10-pack-year history, hypertension, DM, hyperlipidemia and hyperglycemia. Due to PAD with claudication underwent staged intervention to right mid and distal SFA using Hawk One directional atherectomy on 11/03/2018 and also left common iliac artery stenting on 12/15/2018.    This is a 35-monthoffice visit for follow-up of CAD and PAD.  States that he is now completely retired and has been walking regularly, has not had any claudication and he has not had any chest pain.  He is also tolerating all his medications well.  Past Medical History:  Diagnosis Date   Coronary artery disease    Diabetes mellitus [place] complication ([place]    Gout    Myocardial infarct ([facility]     Past Surgical History:  Procedure Laterality Date   CARDIAC CATHETERIZATION     COLONOSCOPY     LOWER EXTREMITY ANGIOGRAPHY N/A 11/03/2018   Procedure: LOWER EXTREMITY ANGIOGRAPHY;  Surgeon: GAdrian Prows MD;  [facility];  Service: Cardiovascular;  Laterality: N/A;   LOWER EXTREMITY ANGIOGRAPHY Left 12/15/2018   Procedure: LOWER EXTREMITY ANGIOGRAPHY;  Surgeon: PNigel Mormon MD;  [facility];  Service: Cardiovascular;  Laterality: Left;   PERIPHERAL VASCULAR ATHERECTOMY  11/03/2018   Procedure: PERIPHERAL VASCULAR ATHERECTOMY;  Surgeon: GAdrian Prows MD;  [facility];  Service: Cardiovascular;;   PERIPHERAL VASCULAR BALLOON ANGIOPLASTY  11/03/2018   Procedure: PERIPHERAL VASCULAR BALLOON ANGIOPLASTY;  Surgeon: GAdrian Prows  MD;  [facility];  Service: Cardiovascular;;   PERIPHERAL VASCULAR INTERVENTION  12/15/2018   Procedure: PERIPHERAL VASCULAR INTERVENTION;  Surgeon: PNigel Mormon MD;  [facility];  Service: Cardiovascular;;  left common iliac   Social History   Tobacco Use   Smoking status: Former Smoker    Packs/day: 1.00    Years: 12.00    Pack years: 12.00    Types: Cigarettes    Quit date: 2007    Years since quitting: 14.7   Smokeless tobacco: Never Used  Substance Use Topics   Alcohol use: Yes    Comment: occ  Marital Status: Divorced   Review of Systems  Cardiovascular: Positive for claudication. Negative for chest pain, dyspnea on exertion and leg swelling.  Musculoskeletal: Positive for arthritis and joint pain.  Gastrointestinal: Negative for melena.    Objective  Blood pressure 136/86, pulse (!) 57, resp. rate 16, height 6' (1.829 m), weight 202 lb (91.6 kg), SpO2 95 %. Body mass index is 27.4 kg/m. Vitals with BMI 01/03/2020 07/05/2019 04/05/2019  Height _0  _1  _2   Weight 202 lbs 201 lbs 2 oz 197 lbs 13 oz  BMI 27.39 [phone] Systolic [id number] Diastolic 86 75 90  Pulse 57 57 70      Physical Exam Constitutional:      General: He is not in acute distress.    Appearance: He is well-developed.  Cardiovascular:     Rate and Rhythm: Normal  rate and regular rhythm.     Pulses: Decreased pulses.          Carotid pulses are 2+ on the right side and 2+ on the left side.      Femoral pulses are 2+ on the right side and 2+ on the left side.      Popliteal pulses are 2+ on the right side and 2+ on the left side.       Dorsalis pedis pulses are 0 on the right side and 1+ on the left side.       Posterior tibial pulses are 1+ on the right side and 1+ on the left side.     Heart sounds: Normal heart sounds. No murmur heard.  No gallop.      Comments: No edema.  Capillary refill normal.  No JVD. Pulmonary:     Effort: Pulmonary  effort is normal.     Breath sounds: Normal breath sounds.  Abdominal:     General: Bowel sounds are normal.     Palpations: Abdomen is soft.     Comments: Ventral hernia noted. Reducible  Musculoskeletal:     Cervical back: Neck supple.      Radiology: No results found.  Laboratory examination:   CMP Latest Ref Rng & Units 12/15/2018 10/16/2018 09/22/2014  Glucose 70 - 99 mg/dL 128(H) 106(H) 113(H)  BUN 8 - 23 mg/dL _0 Creatinine 0.61 - 1.24 mg/dL 0.96 1.05 1.20  Sodium 135 - 145 mmol/L 140 143 136  Potassium 3.5 - 5.1 mmol/L 3.7 4.6 4.1  Chloride 98 - 111 mmol/L 104 103 105  CO2 22 - 32 mmol/L _1 Calcium 8.9 - 10.3 mg/dL 9.1 9.9 9.0  Total Protein 6.0 - 8.5 g/dL - 7.2 -  Total Bilirubin 0.0 - 1.2 mg/dL - 0.6 -  Alkaline Phos 39 - 117 IU/L - 73 -  AST 0 - 40 IU/L - 28 -  ALT 0 - 44 IU/L - 26 -   CBC Latest Ref Rng & Units 12/15/2018 10/16/2018 09/22/2014  WBC 4.0 - 10.5 K/uL 6.1 8.2 6.6  Hemoglobin 13.0 - 17.0 g/dL 15.0 15.8 14.4  Hematocrit 39 - 52 % 44.8 46.0 41.5  Platelets 150 - 400 K/uL 217 232 211   Lipid Panel     Component Value Date/Time   CHOL 128 10/16/2018 1017   TRIG 83 10/16/2018 1017   HDL 60 10/16/2018 1017   CHOLHDL 2.9 07/10/2007 0355   VLDL 20 07/10/2007 0355   LDLCALC 51 10/16/2018 1017   HEMOGLOBIN A1C Lab Results  Component Value Date   HGBA1C 6.3 (H) 10/16/2018   MPG 147 07/09/2007   TSH No results for input(s): TSH in the last 8760 hours.   External Labs    Cholesterol, total 107.000 11/23/2019 HDL 49.000 11/23/2019 LDL-C 45.000 11/23/2019 Triglycerides 53.000 11/23/2019  A1C 6.200 11/23/2019  Hemoglobin 15.000 11/23/2019  Creatinine, Serum 0.990 11/23/2019 Potassium 4.300 11/23/2019 ALT (SGPT) 25.000 11/23/2019   11/16/2018: A1c 6.2%, CBC normal, serum glucose 76 minute grams, BUN 12, creatinine 0.9 and, eGFR greater than 60 mL.  PSA mildly elevated at 5.40.  Total cholesterol 127, triglycerides 110, HDL 54, LDL 51.   Non-HDL cholesterol 73.    Current Outpatient Medications on File Prior to Visit  Medication Sig Dispense Refill   allopurinol (ZYLOPRIM) 100 MG tablet Take 100 mg by mouth 2 (two) times daily.  0   amLODipine (NORVASC) 10 MG tablet TAKE 1 TABLET(10  MG) BY MOUTH DAILY 90 tablet 3   aspirin EC 81 MG tablet Take 162 mg by mouth daily.      atorvastatin (LIPITOR) 20 MG tablet Take 20 mg by mouth daily.      clopidogrel (PLAVIX) 75 MG tablet TAKE 1 TABLET(75 MG) BY MOUTH DAILY 90 tablet 2   gabapentin (NEURONTIN) 100 MG capsule Take 100 mg by mouth at bedtime.      glimepiride (AMARYL) 1 MG tablet Take 1 mg by mouth daily with breakfast.     losartan (COZAAR) 100 MG tablet Take 100 mg by mouth daily.  0   methocarbamol (ROBAXIN) 500 MG tablet Take 1 tablet (500 mg total) by mouth every 8 (eight) hours as needed for muscle spasms. 8 tablet 0   pantoprazole (PROTONIX) 40 MG tablet Take 40 mg by mouth daily.     sildenafil (VIAGRA) 100 MG tablet Take 1 tablet (100 mg total) by mouth daily as needed for erectile dysfunction. 10 tablet 6   indomethacin (INDOCIN) 50 MG capsule Take 50 mg by mouth daily.     tamsulosin (FLOMAX) 0.4 MG CAPS capsule Take 0.4 mg by mouth daily.     No current facility-administered medications on file prior to visit.    Cardiac Studies:   Abdominal aortic duplex 03/19/2017: The maximum aorta diameter is 2.76 cm (dist). Diffuse plaque observed in the mid and distal aorta. Normal flow velocities noted in the aorta and iliac arteries. No AAA noted.  Coronary angiogram 07/09/2007: Stenting of OM branch of circumflex with 3.0 x 15 mm Promus DES.  Left main ostial 20%, mid LAD 50% stenosis.  Diagonal 2, large has mid 90% stenosis.  Exercise sestamibi stress test 11/07/2014: 1. Resting EKG demonstrates normal sinus rhythm, stress EKG was negative for myocardial ischemia.  Occasional PVCs with stress EKG. The patient performed treadmill exercise using a Bruce  protocol, completing 5:01 minutes. The patient completed an estimated workload of 7.03 METS, 100% of the maximum predicted heart rate. Stress symptoms included dyspnea. 2. The perfusion imaging study demonstrates a very small-sized apical inferolateral and a small sized apical-lateral ischemia of mild intensity.  Left ventricle systolic function calculated by QGS was mildly depressed at 49%. Overall the defect is small sized and mild in intensity, low risk stress test.  The defect probably correlates with the diagonal stenosis and may also represent in-stent restenosis in the circumflex coronary artery.  Clinical correlation is recommended.  Lower Extremity Arterial Duplex 09/14/2018: Moderate velocity increase at the right distal superficial femoral artery consistent with >50% stenosis. No hemodynamically significant stenoses are identified in the left lower extremity arterial system. Diffuse disease bilateral lower extremities with biphasic waveform. This exam reveals moderately decreased perfusion of the lower extremity, noted at the post tibial artery level (ABI). Bilateral ABI 0.75 with mildly abnormal biphasic waveform at the ankles. Compared to 03/19/17, right SFA stenosis is new and ABI decreased from 0.94.  Echocardiogram 09/14/2018: Normal LV systolic function with EF 55%. Left ventricle cavity is normal in size. Mild concentric hypertrophy of the left ventricle. Normal global wall motion. Grade I diastolic dysfunction. Calculated EF 55%. Septal motion is paradoxical. Frequent PVCs noted during the test.  Left atrial cavity is mildly dilated. Aneurysmal interatrial septum without 2D or color Doppler evidence of interatrial shunt. Mild (Grade I) mitral regurgitation. Normal right atrial pressure.  Compared to previous study in 2016, PVC new.    Peripheral arteriogram   - 11/03/2018: Abdominal aortogram revealing mild atherosclerotic changes, 2  renal arteries (widely patent.  Left common  iliac artery has a focal 70 to 75%, approximately followed by a 30 to 40% stenosis in the left external iliac artery.  Left SFA in the mid segment has a 50 to 60% stenosis in a tandem fashion.  Left popliteal artery is diffusely diseased but patent with less than 20 to 30% stenosis.  Left anterior tibial) left peroneal is occluded, TP trunk is severely diseased around 80%, one-vessel runoff in the form of PT in the left leg below the knee. Right common iliac, external iliac, CFA are widely patent with mild disease.  Right mid SFA 75% stenosis, right DP (and severely disease with 80% diffuse disease, right AT and peroneal artery are occluded, single-vessel involving the form of PT in the right leg below the knee. Successful atherectomy with HawkOne LS right mid to distal SFA stenosis reduced from 25% to 0% with sluggish flow improved to brisk flow.  - 12/15/2018: Lt CIA: 90% ulcerates lesion. Distal Lt CIA: 50% lesion Mid to distal SFA: Calcific 60& tandem lesions, hemodynamically non-significant (no pressure gradient). Severe below the knee disease with one vessel runoff PT PTA and balloon expanding stent Omnilink 10.0 X 59 mm placement Left common iliac artery   ABI 03/09/2019: This exam reveals mildly decreased perfusion of the right lower extremity, noted at the post tibial artery level (ABI 0.96) and moderately decreased perfusion of the left lower extremity, noted at the anterior tibial artery level (ABI 0.79).  Compared to 12/18/2018, left ABI has decreased back to baseline from 0.96. May suggest restenosis in the prior angioplasty site.  Clinical correlation recommended. Recheck in 3 months. 09/14/2018, bilateral ABI 0.75 - pre-procedure.   Lower Extremity Arterial Duplex 12/29/2019: No hemodynamically significant stenoses are identified in the right lower extremity arterial system. Moderate velocity increase at the left profunda femoral artery. This exam reveals mildly decreased perfusion of  the right lower extremity, noted at the post tibial artery level (ABI 0.89) and moderately decreased perfusion of the left lower extremity, noted at the anterior tibial and post tibial artery level (ABI 0.74).  No significant change from 06/28/2019. Right SFA and left iliac artery angioplasty site appear patent.  EKG   EKG 01/03/2020: Sinus bradycardia at rate of 58 bpm, left atrial enlargement, left axis deviation, left anterior fascicular block.  Single PVC.  No significant change from 04/05/2019.  Assessment     ICD-10-CM   1. Coronary artery disease involving native coronary artery of native heart without angina pectoris  I25.10 EKG 12-Lead  2. Claudication in peripheral vascular disease (HCC)  I73.9   3. Essential hypertension  I10   4. Hypercholesteremia  E78.00   5. Onychomycosis of great toe  B35.1 Ambulatory referral to Podiatry   No orders of the defined types were placed in this encounter.  Medications Discontinued During This Encounter  Medication Reason   cilostazol (PLETAL) 50 MG tablet Completed Course   cilostazol (PLETAL) 50 MG tablet Completed Course     Recommendations:   Jeremy Keller  is a 79 y.o. AA male  with coronary artery disease and angioplasty and stenting to his circumflex coronary artery in 2009,  prior tobacco use disorder, he quit smoking in 2007, probably 10-pack-year history, hypertension, DM, hyperlipidemia and hyperglycemia. Due to PAD with claudication underwent staged intervention to right mid and distal SFA using Hawk One directional atherectomy on 11/03/2018 and also left common iliac artery stenting on 12/15/2018.   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  Continues to remain active but has not had any significant symptoms of claudication.  I reviewed his lower extremity arterial duplex with the patient, ABI is remained stable and angioplasty sites appear to be  patent.  With regard to coronary disease, he has not had any recurrence of angina pectoris.  Blood pressure is well controlled since being on labetalol and is tolerating this without significant bradycardia or side effects.  Continue the same.  Reviewed his labs, lipids are under excellent control.  No changes in the medications were done by me today, I will see him back in 6 months for follow-up.   JAdrian Prows MD, [facility]10/07/2019, 1:26 PM Office: [phone]

## 2020-01-22 ENCOUNTER — Encounter (HOSPITAL_COMMUNITY): Payer: Self-pay

## 2020-01-22 ENCOUNTER — Other Ambulatory Visit: Payer: Self-pay

## 2020-01-22 ENCOUNTER — Ambulatory Visit (HOSPITAL_COMMUNITY)
Admission: EM | Admit: 2020-01-22 | Discharge: 2020-01-22 | Disposition: A | Discharge status: Home / Self Care | Payer: Medicare Other | Attending: Family Medicine | Admitting: Family Medicine

## 2020-01-22 DIAGNOSIS — R319 Hematuria, unspecified: Secondary | ICD-10-CM | POA: Diagnosis not present

## 2020-01-22 DIAGNOSIS — R310 Gross hematuria: Secondary | ICD-10-CM

## 2020-01-22 LAB — POCT URINALYSIS DIPSTICK, ED / UC
Bilirubin Urine: NEGATIVE
Glucose, UA: NEGATIVE mg/dL
Hgb urine dipstick: NEGATIVE
Ketones, ur: NEGATIVE mg/dL
Leukocytes,Ua: NEGATIVE
Nitrite: NEGATIVE
Protein, ur: NEGATIVE mg/dL
Specific Gravity, Urine: 1.02 (ref 1.005–1.030)
Urobilinogen, UA: 0.2 mg/dL (ref 0.0–1.0)
pH: 6 (ref 5.0–8.0)

## 2020-01-22 NOTE — ED Triage Notes (Addendum)
Pt Complaining of blood in urine. Symptoms started last night. Pt states he has a enlarge prostate.Pt states he was not using the bathroom when he noticed the blood he was just sitting down and happen to felt his pants were wet and bloody.

## 2020-01-22 NOTE — ED Provider Notes (Signed)
[facility]    CSN: [id number] Arrival date & time: 01/22/20  1044      History   Chief Complaint Chief Complaint  Patient presents with  . Hematuria    HPI Jeremy Keller is a 79 y.o. male.   Patient presenting today for evaluation of hematuria that started 2 days ago and has resolved the past 24 hours. States he had painless bleeding with urination several times but since yesterday afternoon urine has been clear. Does endorse some mild left low back soreness but otherwise feeling well and in usual state of health. Denies abdominal pain, dysuria, prostate tenderness, fever, chills, sweats, concern for STIs. Follows with Urology for BPH and recently started on tamsulosin, otherwise no known urologic issues.      Past Medical History:  Diagnosis Date  . Coronary artery disease   . Diabetes mellitus without complication (HCC)   . Gout   . Myocardial infarct [facility])     Patient Active Problem List   Diagnosis Date Noted  . CAD 07/09/07: Cx-OM 3x15 Promus, Mid LAD50%, Large D2 90% mid. 08/11/2018  . Essential hypertension 08/11/2018  . Hypercholesteremia 08/11/2018  . Hyperglycemia 08/11/2018  . Claudication in peripheral vascular disease (HCC) 08/11/2018  . Pain in left hip 07/09/2017  . Trochanteric bursitis, left hip 04/10/2016  . Gout     Past Surgical History:  Procedure Laterality Date  . CARDIAC CATHETERIZATION    . COLONOSCOPY    . LOWER EXTREMITY ANGIOGRAPHY N/A 11/03/2018   Procedure: LOWER EXTREMITY ANGIOGRAPHY;  Surgeon: Yates Decamp, MD;  [facility];  Service: Cardiovascular;  Laterality: N/A;  . LOWER EXTREMITY ANGIOGRAPHY Left 12/15/2018   Procedure: LOWER EXTREMITY ANGIOGRAPHY;  Surgeon: Elder Negus, MD;  [facility];  Service: Cardiovascular;  Laterality: Left;  . PERIPHERAL VASCULAR ATHERECTOMY  11/03/2018   Procedure: PERIPHERAL VASCULAR ATHERECTOMY;  Surgeon: Yates Decamp, MD;  [facility];   Service: Cardiovascular;;  . PERIPHERAL VASCULAR BALLOON ANGIOPLASTY  11/03/2018   Procedure: PERIPHERAL VASCULAR BALLOON ANGIOPLASTY;  Surgeon: Yates Decamp, MD;  [facility];  Service: Cardiovascular;;  . PERIPHERAL VASCULAR INTERVENTION  12/15/2018   Procedure: PERIPHERAL VASCULAR INTERVENTION;  Surgeon: Elder Negus, MD;  [facility];  Service: Cardiovascular;;  left common iliac       Home Medications    Prior to Admission medications   Medication Sig Start Date End Date Taking? Authorizing Provider  allopurinol (ZYLOPRIM) 100 MG tablet Take 100 mg by mouth 2 (two) times daily. 09/08/14   [provider]  amLODipine (NORVASC) 10 MG tablet TAKE 1 TABLET(10 MG) BY MOUTH DAILY 12/13/19   Yates Decamp, MD  aspirin EC 81 MG tablet Take 162 mg by mouth daily.     [provider]  atorvastatin (LIPITOR) 20 MG tablet Take 20 mg by mouth daily.  03/01/16   [provider]  clopidogrel (PLAVIX) 75 MG tablet TAKE 1 TABLET(75 MG) BY MOUTH DAILY 07/16/19   Yates Decamp, MD  gabapentin (NEURONTIN) 100 MG capsule Take 100 mg by mouth at bedtime.  02/27/16   [provider]  glimepiride (AMARYL) 1 MG tablet Take 1 mg by mouth daily with breakfast.    [provider]  indomethacin (INDOCIN) 50 MG capsule Take 50 mg by mouth daily.    [provider]  labetalol (NORMODYNE) 100 MG tablet TAKE 2 TABLETS(200 MG) BY MOUTH TWICE DAILY 01/03/20   Jacinto Halim,  Vonna Kotyk, MD  losartan (COZAAR) 100 MG tablet Take 100 mg by mouth daily. 09/08/14   [provider]  methocarbamol (ROBAXIN) 500 MG tablet Take 1 tablet (500 mg total) by mouth every 8 (eight) hours as needed for muscle spasms. 02/07/19   Benjiman Core, MD  pantoprazole (PROTONIX) 40 MG tablet Take 40 mg by mouth daily.    [provider]  sildenafil (VIAGRA) 100 MG tablet Take 1 tablet (100 mg total) by mouth daily as needed for erectile dysfunction. 07/05/19    Yates Decamp, MD  tamsulosin (FLOMAX) 0.4 MG CAPS capsule Take 0.4 mg by mouth daily. 12/12/19   [provider]    Family History History reviewed. No pertinent family history.  Social History Social History   Tobacco Use  . Smoking status: Former Smoker    Packs/day: 1.00    Years: 12.00    Pack years: 12.00    Types: Cigarettes    Quit date: 2007    Years since quitting: 14.8  . Smokeless tobacco: Never Used  Vaping Use  . Vaping Use: Never used  Substance Use Topics  . Alcohol use: Yes    Comment: occ  . Drug use: No     Allergies   Patient has no known allergies.   Review of Systems Review of Systems PER HPI  Physical Exam Triage Vital Signs ED Triage Vitals  Enc Vitals Group     BP 01/22/20 1116 (!) 128/93     Pulse Rate 01/22/20 1116 74     Resp 01/22/20 1116 18     Temp 01/22/20 1116 97.9 F (36.6 C)     Temp src --      SpO2 01/22/20 1116 98 %     Weight --      Height --      Head Circumference --      Peak Flow --      Pain Score 01/22/20 1122 0     Pain Loc --      Pain Edu? --      Excl. in GC? --    No data found.  Updated Vital Signs BP (!) 128/93 (BP Location: Right Arm)   Pulse 74   Temp 97.9 F (36.6 C)   Resp 18   SpO2 98%   Visual Acuity Right Eye Distance:   Left Eye Distance:   Bilateral Distance:    Right Eye Near:   Left Eye Near:    Bilateral Near:     Physical Exam Vitals and nursing note reviewed.  Constitutional:      Appearance: Normal appearance.  HENT:     Head: Atraumatic.  Eyes:     Extraocular Movements: Extraocular movements intact.     Conjunctiva/sclera: Conjunctivae normal.  Cardiovascular:     Rate and Rhythm: Normal rate and regular rhythm.  Pulmonary:     Effort: Pulmonary effort is normal.     Breath sounds: Normal breath sounds.  Abdominal:     General: Bowel sounds are normal. There is no distension.     Palpations: Abdomen is soft.     Tenderness: There is no abdominal  tenderness. There is no right CVA tenderness, left CVA tenderness or guarding.  Musculoskeletal:        General: Normal range of motion.     Cervical back: Normal range of motion and neck supple.  Skin:    General: Skin is warm and dry.  Neurological:     General: No focal  deficit present.     Mental Status: He is oriented to person, place, and time.  Psychiatric:        Mood and Affect: Mood normal.        Thought Content: Thought content normal.        Judgment: Judgment normal.    UC Treatments / Results  Labs (all labs ordered are listed, but only abnormal results are displayed) Labs Reviewed  POCT URINALYSIS DIPSTICK, ED / UC    EKG   Radiology No results found.  Procedures Procedures (including critical care time)  Medications Ordered in UC Medications - No data to display  Initial Impression / Assessment and Plan / UC Course  I have reviewed the triage vital signs and the nursing notes.  Pertinent labs & imaging results that were available during my care of the patient were reviewed by me and considered in my medical decision making (see chart for details).     U/A today benign without evidence of RBCs and sxs resolved, exam and vitals reassuring. Reassurance given to pt, possibly small kidney stone given his flank pain. Appropriate for outpatient Urologic f/u if recurring.   Final Clinical Impressions(s) / UC Diagnoses   Final diagnoses:  Gross hematuria   Discharge Instructions   None    ED Prescriptions    None     PDMP not reviewed this encounter.   Particia Nearing, [place] 01/22/20 1243

## 2020-01-27 ENCOUNTER — Other Ambulatory Visit: Payer: Self-pay

## 2020-01-27 ENCOUNTER — Ambulatory Visit (INDEPENDENT_AMBULATORY_CARE_PROVIDER_SITE_OTHER): Payer: Medicare Other | Admitting: Podiatry

## 2020-01-27 DIAGNOSIS — M79674 Pain in right toe(s): Secondary | ICD-10-CM

## 2020-01-27 DIAGNOSIS — B351 Tinea unguium: Secondary | ICD-10-CM

## 2020-01-27 DIAGNOSIS — L603 Nail dystrophy: Secondary | ICD-10-CM | POA: Diagnosis not present

## 2020-01-27 DIAGNOSIS — M79675 Pain in left toe(s): Secondary | ICD-10-CM | POA: Diagnosis not present

## 2020-01-27 MED ORDER — CICLOPIROX 8 % EX SOLN: Freq: Every day | CUTANEOUS | 2 refills | Status: DC

## 2020-01-27 NOTE — Progress Notes (Signed)
    Subjective:  Patient ID: Jeremy Keller, male    DOB: 11-Dec-1940,  MRN: [id number]  Chief Complaint  Patient presents with  . Nail Problem    left hallux nail is cracking     79 y.o. male presents with the above complaint. History confirmed with patient.   Objective:  Physical Exam: warm, good capillary refill, no trophic changes or ulcerative lesions, normal DP and PT pulses and normal sensory exam. Left Foot: Hallux nails mycotic with subungual debris, onycholysis and splitting of the nail with yellow discoloration and thickening  Assessment:   1. Onychomycosis   2. Pain due to onychomycosis of toenails of both feet   3. Splitting of nail      Plan:  Patient was evaluated and treated and all questions answered.  Discussed the etiology and treatment options for the condition in detail with the patient. Educated patient on the topical and oral treatment options for mycotic nails. Recommended debridement of the nails today. Sharp and mechanical debridement performed of all painful and mycotic nails today. Nails debrided in length and thickness using a nail nipper and a mechanical burr to level of comfort. Discussed treatment options including appropriate shoe gear. Follow up as needed for painful nails.  Discussed with him that the damage to the nail may be permanent and may not resolve, however we will treat with Penlac and see if it improves.  Return if symptoms worsen or fail to improve.

## 2020-04-01 ENCOUNTER — Other Ambulatory Visit: Payer: Self-pay | Admitting: Cardiology

## 2020-04-06 DIAGNOSIS — M1990 Unspecified osteoarthritis, unspecified site: Secondary | ICD-10-CM | POA: Diagnosis not present

## 2020-04-06 DIAGNOSIS — E7800 Pure hypercholesterolemia, unspecified: Secondary | ICD-10-CM | POA: Diagnosis not present

## 2020-04-06 DIAGNOSIS — I2510 Atherosclerotic heart disease of native coronary artery without angina pectoris: Secondary | ICD-10-CM | POA: Diagnosis not present

## 2020-04-06 DIAGNOSIS — I252 Old myocardial infarction: Secondary | ICD-10-CM | POA: Diagnosis not present

## 2020-04-06 DIAGNOSIS — I10 Essential (primary) hypertension: Secondary | ICD-10-CM | POA: Diagnosis not present

## 2020-04-06 DIAGNOSIS — K219 Gastro-esophageal reflux disease without esophagitis: Secondary | ICD-10-CM | POA: Diagnosis not present

## 2020-04-06 DIAGNOSIS — E1140 Type 2 diabetes mellitus with diabetic neuropathy, unspecified: Secondary | ICD-10-CM | POA: Diagnosis not present

## 2020-05-25 DIAGNOSIS — E7800 Pure hypercholesterolemia, unspecified: Secondary | ICD-10-CM | POA: Diagnosis not present

## 2020-05-25 DIAGNOSIS — I10 Essential (primary) hypertension: Secondary | ICD-10-CM | POA: Diagnosis not present

## 2020-05-25 DIAGNOSIS — I2510 Atherosclerotic heart disease of native coronary artery without angina pectoris: Secondary | ICD-10-CM | POA: Diagnosis not present

## 2020-05-25 DIAGNOSIS — N451 Epididymitis: Secondary | ICD-10-CM | POA: Diagnosis not present

## 2020-05-25 DIAGNOSIS — M109 Gout, unspecified: Secondary | ICD-10-CM | POA: Diagnosis not present

## 2020-05-25 DIAGNOSIS — M1990 Unspecified osteoarthritis, unspecified site: Secondary | ICD-10-CM | POA: Diagnosis not present

## 2020-05-25 DIAGNOSIS — E1140 Type 2 diabetes mellitus with diabetic neuropathy, unspecified: Secondary | ICD-10-CM | POA: Diagnosis not present

## 2020-05-25 DIAGNOSIS — K219 Gastro-esophageal reflux disease without esophagitis: Secondary | ICD-10-CM | POA: Diagnosis not present

## 2020-05-25 DIAGNOSIS — I739 Peripheral vascular disease, unspecified: Secondary | ICD-10-CM | POA: Diagnosis not present

## 2020-05-25 DIAGNOSIS — I252 Old myocardial infarction: Secondary | ICD-10-CM | POA: Diagnosis not present

## 2020-06-26 ENCOUNTER — Other Ambulatory Visit: Payer: Self-pay

## 2020-06-26 DIAGNOSIS — M1990 Unspecified osteoarthritis, unspecified site: Secondary | ICD-10-CM | POA: Diagnosis not present

## 2020-06-26 DIAGNOSIS — I10 Essential (primary) hypertension: Secondary | ICD-10-CM | POA: Diagnosis not present

## 2020-06-26 DIAGNOSIS — E1140 Type 2 diabetes mellitus with diabetic neuropathy, unspecified: Secondary | ICD-10-CM | POA: Diagnosis not present

## 2020-06-26 DIAGNOSIS — I2510 Atherosclerotic heart disease of native coronary artery without angina pectoris: Secondary | ICD-10-CM | POA: Diagnosis not present

## 2020-06-26 DIAGNOSIS — E7800 Pure hypercholesterolemia, unspecified: Secondary | ICD-10-CM | POA: Diagnosis not present

## 2020-06-26 DIAGNOSIS — K219 Gastro-esophageal reflux disease without esophagitis: Secondary | ICD-10-CM | POA: Diagnosis not present

## 2020-06-26 DIAGNOSIS — I252 Old myocardial infarction: Secondary | ICD-10-CM | POA: Diagnosis not present

## 2020-06-26 MED ORDER — LABETALOL HCL 100 MG PO TABS: 100.0000 mg | ORAL_TABLET | Freq: Two times a day (BID) | ORAL | 1 refills | Status: DC

## 2020-07-03 ENCOUNTER — Ambulatory Visit: Payer: Medicare Other | Admitting: Cardiology

## 2020-08-11 ENCOUNTER — Ambulatory Visit: Payer: Medicare Other | Admitting: Cardiology

## 2020-08-11 ENCOUNTER — Encounter: Payer: Self-pay | Admitting: Cardiology

## 2020-08-11 ENCOUNTER — Other Ambulatory Visit: Payer: Self-pay

## 2020-08-11 VITALS — BP 111/63 | HR 71 | Temp 98.1°F | Resp 16 | Ht 72.0 in | Wt 206.0 lb

## 2020-08-11 DIAGNOSIS — I739 Peripheral vascular disease, unspecified: Secondary | ICD-10-CM

## 2020-08-11 DIAGNOSIS — E7800 Pure hypercholesterolemia, unspecified: Secondary | ICD-10-CM | POA: Diagnosis not present

## 2020-08-11 DIAGNOSIS — I2510 Atherosclerotic heart disease of native coronary artery without angina pectoris: Secondary | ICD-10-CM | POA: Diagnosis not present

## 2020-08-11 DIAGNOSIS — I10 Essential (primary) hypertension: Secondary | ICD-10-CM | POA: Diagnosis not present

## 2020-08-11 NOTE — Progress Notes (Signed)
Primary Physician/Referring:  Georgann Housekeeper, MD  Patient ID: Jeremy Keller, male    DOB: 09-Oct-1940, 80 y.o.   MRN: [id number]  Chief Complaint  Patient presents with  . Coronary Artery Disease  . PAD  . Follow-up    6 months    HPI: Jeremy Keller  is a 80 y.o. male  with coronary artery disease and angioplasty and stenting to his circumflex coronary artery in 2009,  prior tobacco use disorder, he quit smoking in 2007, probably 10-pack-year history, hypertension, DM, hyperlipidemia and hyperglycemia. Due to PAD with claudication underwent staged intervention to right mid and distal SFA using Hawk One directional atherectomy on 11/03/2018 and also left common iliac artery stenting on 12/15/2018.    This is a 82-month office visit for follow-up of CAD and PAD.  He has not had any chest pain and also states that he has not had any claudication.  He is tolerating all his medications well.  His main complaint is arthritis in his hand and also swelling and arthritis in his left hand as well.  No fever or chills, symptoms ongoing for 2 weeks.  Past Medical History:  Diagnosis Date  . Coronary artery disease   . Diabetes mellitus without complication (HCC)   . Gout   . Myocardial infarct [facility])     Past Surgical History:  Procedure Laterality Date  . CARDIAC CATHETERIZATION    . COLONOSCOPY    . LOWER EXTREMITY ANGIOGRAPHY N/A 11/03/2018   Procedure: LOWER EXTREMITY ANGIOGRAPHY;  Surgeon: Yates Decamp, MD;  Location: [facility] INVASIVE CV LAB;  Service: Cardiovascular;  Laterality: N/A;  . LOWER EXTREMITY ANGIOGRAPHY Left 12/15/2018   Procedure: LOWER EXTREMITY ANGIOGRAPHY;  Surgeon: Elder Negus, MD;  Location: [facility] INVASIVE CV LAB;  Service: Cardiovascular;  Laterality: Left;  . PERIPHERAL VASCULAR ATHERECTOMY  11/03/2018   Procedure: PERIPHERAL VASCULAR ATHERECTOMY;  Surgeon: Yates Decamp, MD;  Location: [facility] INVASIVE CV LAB;  Service: Cardiovascular;;  . PERIPHERAL VASCULAR BALLOON ANGIOPLASTY   11/03/2018   Procedure: PERIPHERAL VASCULAR BALLOON ANGIOPLASTY;  Surgeon: Yates Decamp, MD;  Location: [facility] INVASIVE CV LAB;  Service: Cardiovascular;;  . PERIPHERAL VASCULAR INTERVENTION  12/15/2018   Procedure: PERIPHERAL VASCULAR INTERVENTION;  Surgeon: Elder Negus, MD;  Location: [facility] INVASIVE CV LAB;  Service: Cardiovascular;;  left common iliac   Social History   Tobacco Use  . Smoking status: Former Smoker    Packs/day: 1.00    Years: 12.00    Pack years: 12.00    Types: Cigarettes    Quit date: 2007    Years since quitting: 15.3  . Smokeless tobacco: Never Used  . Tobacco comment: quit in 2002  Substance Use Topics  . Alcohol use: Yes    Alcohol/week: 1.0 standard drink    Types: 1 Glasses of wine per week    Comment: occasional  Marital Status: Divorced   Review of Systems  Cardiovascular: Negative for chest pain, claudication, dyspnea on exertion and leg swelling.  Musculoskeletal: Positive for arthritis and joint pain.  Gastrointestinal: Negative for melena.    Objective  Blood pressure 111/63, pulse 71, temperature 98.1 F (36.7 C), temperature source Temporal, resp. rate 16, height 6' (1.829 m), weight 206 lb (93.4 kg), SpO2 94 %. Body mass index is 27.94 kg/m. Vitals with BMI 08/11/2020 01/22/2020 01/03/2020  Height 6\' 0"  - 6\' 0"   Weight 206 lbs - 202 lbs  BMI 27.93 - 27.39  Systolic 111 128  Diastolic 63 93 86  Pulse 71 74 57      Physical Exam Constitutional:      General: He is not in acute distress.    Appearance: He is well-developed.  Cardiovascular:     Rate and Rhythm: Normal rate and regular rhythm.     Pulses: Decreased pulses.          Carotid pulses are 2+ on the right side and 2+ on the left side.      Femoral pulses are 2+ on the right side and 2+ on the left side.      Popliteal pulses are 2+ on the right side and 2+ on the left side.       Dorsalis pedis pulses are 0 on the right side and 1+ on the left side.       Posterior tibial  pulses are 1+ on the right side and 1+ on the left side.     Heart sounds: Normal heart sounds. No murmur heard. No gallop.      Comments: No edema.  Capillary refill normal.  No JVD. Pulmonary:     Effort: Pulmonary effort is normal.     Breath sounds: Normal breath sounds.  Abdominal:     General: Bowel sounds are normal.     Palpations: Abdomen is soft.     Comments: Ventral hernia noted. Reducible  Musculoskeletal:     Cervical back: Neck supple.      Radiology: No results found.  Laboratory examination:   CMP Latest Ref Rng & Units 12/15/2018 10/16/2018 09/22/2014  Glucose 70 - 99 mg/dL 128(H) 106(H) 113(H)  BUN 8 - 23 mg/dL $Remove'8 10 19  'ndUbSqx$ Creatinine 0.61 - 1.24 mg/dL 0.96 1.05 1.20  Sodium 135 - 145 mmol/L 140 143 136  Potassium 3.5 - 5.1 mmol/L 3.7 4.6 4.1  Chloride 98 - 111 mmol/L 104 103 105  CO2 22 - 32 mmol/L $RemoveB'25 24 22  'tUfoHTcx$ Calcium 8.9 - 10.3 mg/dL 9.1 9.9 9.0  Total Protein 6.0 - 8.5 g/dL - 7.2 -  Total Bilirubin 0.0 - 1.2 mg/dL - 0.6 -  Alkaline Phos 39 - 117 IU/L - 73 -  AST 0 - 40 IU/L - 28 -  ALT 0 - 44 IU/L - 26 -   CBC Latest Ref Rng & Units 12/15/2018 10/16/2018 09/22/2014  WBC 4.0 - 10.5 K/uL 6.1 8.2 6.6  Hemoglobin 13.0 - 17.0 g/dL 15.0 15.8 14.4  Hematocrit 39.0 - 52.0 % 44.8 46.0 41.5  Platelets 150 - 400 K/uL 217 232 211   Lipid Panel     Component Value Date/Time   CHOL 128 10/16/2018 1017   TRIG 83 10/16/2018 1017   HDL 60 10/16/2018 1017   CHOLHDL 2.9 07/10/2007 0355   VLDL 20 07/10/2007 0355   LDLCALC 51 10/16/2018 1017    External Labs    Lab 05/25/2020:  A1c 6.0%.  Labs 12/29/2019:  Hb 15.0/HCT 44.5, platelets 224.  Normal indicis.  Serum glucose 110 mg, BUN 12, creatinine 0.99, potassium 4.3, CMP otherwise normal.  Total cholesterol 107, triglycerides 53, HDL 49, LDL 45.  11/16/2018: A1c 6.2%, CBC normal, serum glucose 76 minute grams, BUN 12, creatinine 0.9 and, eGFR greater than 60 mL.  PSA mildly elevated at 5.40.  Total cholesterol  127, triglycerides 110, HDL 54, LDL 51.  Non-HDL cholesterol 73.    Current Outpatient Medications on File Prior to Visit  Medication Sig Dispense Refill  . allopurinol (ZYLOPRIM) 100 MG tablet Take 100 mg by mouth 2 (two)  times daily.  0  . amLODipine (NORVASC) 10 MG tablet TAKE 1 TABLET(10 MG) BY MOUTH DAILY 90 tablet 3  . aspirin EC 81 MG tablet Take 162 mg by mouth daily.     Marland Kitchen atorvastatin (LIPITOR) 20 MG tablet Take 20 mg by mouth daily.     . ciclopirox (PENLAC) 8 % solution Apply topically at bedtime. Apply over nail and surrounding skin. Apply daily over previous coat. After seven (7) days, may remove with alcohol and continue cycle. 6.6 mL 2  . clopidogrel (PLAVIX) 75 MG tablet TAKE 1 TABLET(75 MG) BY MOUTH DAILY 90 tablet 2  . gabapentin (NEURONTIN) 100 MG capsule Take 100 mg by mouth at bedtime.     Marland Kitchen glimepiride (AMARYL) 1 MG tablet Take 1 mg by mouth daily with breakfast.    . indomethacin (INDOCIN) 50 MG capsule Take 50 mg by mouth daily.    Marland Kitchen labetalol (NORMODYNE) 100 MG tablet Take 1 tablet (100 mg total) by mouth 2 (two) times daily. 180 tablet 1  . losartan (COZAAR) 100 MG tablet Take 100 mg by mouth daily.  0  . methocarbamol (ROBAXIN) 500 MG tablet Take 1 tablet (500 mg total) by mouth every 8 (eight) hours as needed for muscle spasms. 8 tablet 0  . pantoprazole (PROTONIX) 40 MG tablet Take 40 mg by mouth daily.    . sildenafil (VIAGRA) 100 MG tablet Take 1 tablet (100 mg total) by mouth daily as needed for erectile dysfunction. 10 tablet 6  . tamsulosin (FLOMAX) 0.4 MG CAPS capsule Take 0.4 mg by mouth daily.     No current facility-administered medications on file prior to visit.    Cardiac Studies:   Abdominal aortic duplex 03/19/2017: The maximum aorta diameter is 2.76 cm (dist). Diffuse plaque observed in the mid and distal aorta. Normal flow velocities noted in the aorta and iliac arteries. No AAA noted.  Coronary angiogram 07/09/2007: Stenting of OM branch of  circumflex with 3.0 x 15 mm Promus DES.  Left main ostial 20%, mid LAD 50% stenosis.  Diagonal 2, large has mid 90% stenosis.  Exercise sestamibi stress test 11/07/2014: 1. Resting EKG demonstrates normal sinus rhythm, stress EKG was negative for myocardial ischemia.  Occasional PVCs with stress EKG. The patient performed treadmill exercise using a Bruce protocol, completing 5:01 minutes. The patient completed an estimated workload of 7.03 METS, 100% of the maximum predicted heart rate. Stress symptoms included dyspnea. 2. The perfusion imaging study demonstrates a very small-sized apical inferolateral and a small sized apical-lateral ischemia of mild intensity.  Left ventricle systolic function calculated by QGS was mildly depressed at 49%. Overall the defect is small sized and mild in intensity, low risk stress test.  The defect probably correlates with the diagonal stenosis and may also represent in-stent restenosis in the circumflex coronary artery.  Clinical correlation is recommended.  Lower Extremity Arterial Duplex 09/14/2018: Moderate velocity increase at the right distal superficial femoral artery consistent with >50% stenosis. No hemodynamically significant stenoses are identified in the left lower extremity arterial system. Diffuse disease bilateral lower extremities with biphasic waveform. This exam reveals moderately decreased perfusion of the lower extremity, noted at the post tibial artery level (ABI). Bilateral ABI 0.75 with mildly abnormal biphasic waveform at the ankles. Compared to 03/19/17, right SFA stenosis is new and ABI decreased from 0.94.  Echocardiogram 09/14/2018: Normal LV systolic function with EF 55%. Left ventricle cavity is normal in size. Mild concentric hypertrophy of the left ventricle. Normal global  wall motion. Grade I diastolic dysfunction. Calculated EF 55%. Septal motion is paradoxical. Frequent PVCs noted during the test.  Left atrial cavity is mildly  dilated. Aneurysmal interatrial septum without 2D or color Doppler evidence of interatrial shunt. Mild (Grade I) mitral regurgitation. Normal right atrial pressure.  Compared to previous study in 2016, PVC new.    Peripheral arteriogram   - 11/03/2018: Abdominal aortogram revealing mild atherosclerotic changes, 2 renal arteries (widely patent.  Left common iliac artery has a focal 70 to 75%, approximately followed by a 30 to 40% stenosis in the left external iliac artery.  Left SFA in the mid segment has a 50 to 60% stenosis in a tandem fashion.  Left popliteal artery is diffusely diseased but patent with less than 20 to 30% stenosis.  Left anterior tibial) left peroneal is occluded, TP trunk is severely diseased around 80%, one-vessel runoff in the form of PT in the left leg below the knee. Right common iliac, external iliac, CFA are widely patent with mild disease.  Right mid SFA 75% stenosis, right DP (and severely disease with 80% diffuse disease, right AT and peroneal artery are occluded, single-vessel involving the form of PT in the right leg below the knee. Successful atherectomy with HawkOne LS right mid to distal SFA stenosis reduced from 25% to 0% with sluggish flow improved to brisk flow.  - 12/15/2018: Lt CIA: 90% ulcerates lesion. Distal Lt CIA: 50% lesion Mid to distal SFA: Calcific 60& tandem lesions, hemodynamically non-significant (no pressure gradient). Severe below the knee disease with one vessel runoff PT PTA and balloon expanding stent Omnilink 10.0 X 59 mm placement Left common iliac artery   ABI 03/09/2019: This exam reveals mildly decreased perfusion of the right lower extremity, noted at the post tibial artery level (ABI 0.96) and moderately decreased perfusion of the left lower extremity, noted at the anterior tibial artery level (ABI 0.79).  Compared to 12/18/2018, left ABI has decreased back to baseline from 0.96. May suggest restenosis in the prior angioplasty site.   Clinical correlation recommended. Recheck in 3 months. 09/14/2018, bilateral ABI 0.75 - pre-procedure.   Lower Extremity Arterial Duplex 12/29/2019: No hemodynamically significant stenoses are identified in the right lower extremity arterial system. Moderate velocity increase at the left profunda femoral artery. This exam reveals mildly decreased perfusion of the right lower extremity, noted at the post tibial artery level (ABI 0.89) and moderately decreased perfusion of the left lower extremity, noted at the anterior tibial and post tibial artery level (ABI 0.74).  No significant change from 06/28/2019. Right SFA and left iliac artery angioplasty site appear patent.  EKG   EKG 08/11/2020: Normal sinus rhythm at rate of 62 bpm, left axis deviation, left anterior fascicular block.  Poor R wave progression, probably normal variant but cannot exclude anterolateral infarct old.  No evidence of ischemia.  No significant change from EKG 01/03/2020   Assessment     ICD-10-CM   1. Coronary artery disease involving native coronary artery of native heart without angina pectoris  I25.10 EKG 12-Lead  2. Claudication in peripheral vascular disease (HCC)  I73.9   3. Essential hypertension  I10   4. Hypercholesteremia  E78.00    No orders of the defined types were placed in this encounter.  There are no discontinued medications.   Recommendations:   Jeremy Keller  is a 80 y.o. AA male  with coronary artery disease and angioplasty and stenting to his circumflex coronary artery in 2009,  prior  tobacco use disorder, he quit smoking in 2007, probably 10-pack-year history, hypertension, DM, hyperlipidemia and hyperglycemia. Due to PAD with claudication underwent staged intervention to right mid and distal SFA using Hawk One directional atherectomy on 11/03/2018 and also left common iliac artery stenting on 12/15/2018.   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  Continues to remain active but has not had any significant symptoms of claudication or symptoms of angina.   Blood pressure is well controlled since being on labetalol and is tolerating this without significant bradycardia or side effects.  Continue the same.  Reviewed his labs, lipids are under excellent control.  No changes in the medications were done by me today, I will see him back on annual basis.  We have called his PCPs office to see if he can be evaluated for his arthritis in his hands on a semiurgent basis.  Patient will be contacted by Dr. Glenna Durand PA to see him this afternoon.   Adrian Prows, MD, [facility] 08/11/2020, 2:27 PM Office: [phone]

## 2020-08-17 ENCOUNTER — Other Ambulatory Visit: Payer: Self-pay | Admitting: Physician Assistant

## 2020-08-17 ENCOUNTER — Ambulatory Visit
Admission: RE | Admit: 2020-08-17 | Discharge: 2020-08-17 | Disposition: A | Discharge status: Home / Self Care | Payer: Medicare Other | Source: Ambulatory Visit | Attending: Physician Assistant | Admitting: Physician Assistant

## 2020-08-17 DIAGNOSIS — M7989 Other specified soft tissue disorders: Secondary | ICD-10-CM

## 2020-08-17 DIAGNOSIS — E1140 Type 2 diabetes mellitus with diabetic neuropathy, unspecified: Secondary | ICD-10-CM | POA: Diagnosis not present

## 2020-08-17 DIAGNOSIS — M79641 Pain in right hand: Secondary | ICD-10-CM | POA: Diagnosis not present

## 2020-08-23 ENCOUNTER — Encounter: Payer: Self-pay | Admitting: Orthopaedic Surgery

## 2020-08-23 ENCOUNTER — Ambulatory Visit (INDEPENDENT_AMBULATORY_CARE_PROVIDER_SITE_OTHER): Payer: Medicare Other | Admitting: Orthopaedic Surgery

## 2020-08-23 DIAGNOSIS — M79642 Pain in left hand: Secondary | ICD-10-CM

## 2020-08-23 DIAGNOSIS — M79641 Pain in right hand: Secondary | ICD-10-CM | POA: Diagnosis not present

## 2020-08-23 NOTE — Progress Notes (Signed)
Office Visit Note   Patient: Jeremy Keller           Date of Birth: 1940/10/17           MRN: [id number] Visit Date: 08/23/2020              Requested by: Georgann Housekeeper, MD [address] [place],  [place] [address] PCP: Georgann Housekeeper, MD   Assessment & Plan: Visit Diagnoses:  1. Bilateral hand pain     Plan: I spoke with him about trying a steroid injection in the right thumb and the left fifth finger but he wants to hold off on this since also recommended Voltaren gel to try twice a day.  I will see him back in a month to see if this is helped.  If this is not we will consider injections.  I can always see him for his right knee at that visit as well.  Follow-Up Instructions: Return in about 4 weeks (around 09/20/2020).   Orders:  No orders of the defined types were placed in this encounter.  No orders of the defined types were placed in this encounter.     Procedures: No procedures performed   Clinical Data: No additional findings.   Subjective: Chief Complaint  Patient presents with  . Right Hand - Pain  . Left Hand - Pain  The patient comes in today with bilateral hand pain.  He is a diabetic and has some neuropathy but this is different for him.  He points to the MCP joint of his right thumb as well as the A1 pulley right thumb and the A1 pulley of the left fifth finger as a source of his main pain.  He also has some right knee pain but he wants to talk about that may be at another visit.  He is 80 years old.  He has been working on better blood glucose control.  He really denies any numbness and tingling in his fingers.  HPI  Review of Systems He currently denies any headache, chest pain, shortness of breath, fever, chills, nausea, vomiting  Objective: Vital Signs: There were no vitals taken for this visit.  Physical Exam He is alert and orient x3 and in no acute distress Ortho Exam Examination of his right hand does show pain all along the  MCP joint of the thumb.  His ligaments are stable.  There is also A1 pulley pain over the thumb on the right side but no triggering.  There is no muscle atrophy of either hand.  His left hand hurts over the A1 pulley of the fifth finger and some of the fourth. Specialty Comments:  No specialty comments available.  Imaging: No results found.   PMFS History: Patient Active Problem List   Diagnosis Date Noted  . CAD 07/09/07: Cx-OM 3x15 Promus, Mid LAD50%, Large D2 90% mid. 08/11/2018  . Essential hypertension 08/11/2018  . Hypercholesteremia 08/11/2018  . Hyperglycemia 08/11/2018  . Claudication in peripheral vascular disease (HCC) 08/11/2018  . Pain in left hip 07/09/2017  . Trochanteric bursitis, left hip 04/10/2016  . Gout    Past Medical History:  Diagnosis Date  . Coronary artery disease   . Diabetes mellitus without complication (HCC)   . Gout   . Myocardial infarct [facility])     Family History  Problem Relation Age of Onset  . Lung cancer Brother   . Heart attack Sister     Past Surgical History:  Procedure Laterality  Date  . CARDIAC CATHETERIZATION    . COLONOSCOPY    . LOWER EXTREMITY ANGIOGRAPHY N/A 11/03/2018   Procedure: LOWER EXTREMITY ANGIOGRAPHY;  Surgeon: Yates Decamp, MD;  Location: [facility] INVASIVE CV LAB;  Service: Cardiovascular;  Laterality: N/A;  . LOWER EXTREMITY ANGIOGRAPHY Left 12/15/2018   Procedure: LOWER EXTREMITY ANGIOGRAPHY;  Surgeon: Elder Negus, MD;  Location: [facility] INVASIVE CV LAB;  Service: Cardiovascular;  Laterality: Left;  . PERIPHERAL VASCULAR ATHERECTOMY  11/03/2018   Procedure: PERIPHERAL VASCULAR ATHERECTOMY;  Surgeon: Yates Decamp, MD;  Location: [facility] INVASIVE CV LAB;  Service: Cardiovascular;;  . PERIPHERAL VASCULAR BALLOON ANGIOPLASTY  11/03/2018   Procedure: PERIPHERAL VASCULAR BALLOON ANGIOPLASTY;  Surgeon: Yates Decamp, MD;  Location: [facility] INVASIVE CV LAB;  Service: Cardiovascular;;  . PERIPHERAL VASCULAR INTERVENTION  12/15/2018   Procedure:  PERIPHERAL VASCULAR INTERVENTION;  Surgeon: Elder Negus, MD;  Location: [facility] INVASIVE CV LAB;  Service: Cardiovascular;;  left common iliac   Social History   Occupational History  . Not on file  Tobacco Use  . Smoking status: Former Smoker    Packs/day: 1.00    Years: 12.00    Pack years: 12.00    Types: Cigarettes    Quit date: 2007    Years since quitting: 15.4  . Smokeless tobacco: Never Used  . Tobacco comment: quit in 2002  Vaping Use  . Vaping Use: Never used  Substance and Sexual Activity  . Alcohol use: Yes    Alcohol/week: 1.0 standard drink    Types: 1 Glasses of wine per week    Comment: occasional  . Drug use: No  . Sexual activity: Not on file

## 2020-08-29 DIAGNOSIS — E1140 Type 2 diabetes mellitus with diabetic neuropathy, unspecified: Secondary | ICD-10-CM | POA: Diagnosis not present

## 2020-08-29 DIAGNOSIS — I2510 Atherosclerotic heart disease of native coronary artery without angina pectoris: Secondary | ICD-10-CM | POA: Diagnosis not present

## 2020-08-29 DIAGNOSIS — I10 Essential (primary) hypertension: Secondary | ICD-10-CM | POA: Diagnosis not present

## 2020-08-29 DIAGNOSIS — K219 Gastro-esophageal reflux disease without esophagitis: Secondary | ICD-10-CM | POA: Diagnosis not present

## 2020-08-29 DIAGNOSIS — I252 Old myocardial infarction: Secondary | ICD-10-CM | POA: Diagnosis not present

## 2020-08-29 DIAGNOSIS — E7800 Pure hypercholesterolemia, unspecified: Secondary | ICD-10-CM | POA: Diagnosis not present

## 2020-08-29 DIAGNOSIS — M1990 Unspecified osteoarthritis, unspecified site: Secondary | ICD-10-CM | POA: Diagnosis not present

## 2020-09-07 DIAGNOSIS — R609 Edema, unspecified: Secondary | ICD-10-CM | POA: Diagnosis not present

## 2020-09-08 DIAGNOSIS — E1140 Type 2 diabetes mellitus with diabetic neuropathy, unspecified: Secondary | ICD-10-CM | POA: Diagnosis not present

## 2020-09-08 DIAGNOSIS — I2510 Atherosclerotic heart disease of native coronary artery without angina pectoris: Secondary | ICD-10-CM | POA: Diagnosis not present

## 2020-09-08 DIAGNOSIS — M1990 Unspecified osteoarthritis, unspecified site: Secondary | ICD-10-CM | POA: Diagnosis not present

## 2020-09-08 DIAGNOSIS — K219 Gastro-esophageal reflux disease without esophagitis: Secondary | ICD-10-CM | POA: Diagnosis not present

## 2020-09-08 DIAGNOSIS — E7800 Pure hypercholesterolemia, unspecified: Secondary | ICD-10-CM | POA: Diagnosis not present

## 2020-09-08 DIAGNOSIS — I10 Essential (primary) hypertension: Secondary | ICD-10-CM | POA: Diagnosis not present

## 2020-09-08 DIAGNOSIS — I252 Old myocardial infarction: Secondary | ICD-10-CM | POA: Diagnosis not present

## 2020-09-20 ENCOUNTER — Ambulatory Visit: Payer: Medicare Other | Admitting: Orthopaedic Surgery

## 2020-09-26 ENCOUNTER — Ambulatory Visit: Payer: Self-pay

## 2020-09-26 ENCOUNTER — Ambulatory Visit (INDEPENDENT_AMBULATORY_CARE_PROVIDER_SITE_OTHER): Payer: Medicare Other | Admitting: Orthopaedic Surgery

## 2020-09-26 ENCOUNTER — Encounter: Payer: Self-pay | Admitting: Orthopaedic Surgery

## 2020-09-26 DIAGNOSIS — M25561 Pain in right knee: Secondary | ICD-10-CM | POA: Diagnosis not present

## 2020-09-26 DIAGNOSIS — M79642 Pain in left hand: Secondary | ICD-10-CM

## 2020-09-26 DIAGNOSIS — M79641 Pain in right hand: Secondary | ICD-10-CM

## 2020-09-26 DIAGNOSIS — M1711 Unilateral primary osteoarthritis, right knee: Secondary | ICD-10-CM

## 2020-09-26 NOTE — Progress Notes (Signed)
Office Visit Note   Patient: Jeremy Keller           Date of Birth: 10/10/1940           MRN: [id number] Visit Date: 09/26/2020              Requested by: Georgann Housekeeper, MD [address] [place],  [place] [address] PCP: Georgann Housekeeper, MD   Assessment & Plan: Visit Diagnoses:  1. Right knee pain, unspecified chronicity   2. Primary osteoarthritis of right knee   3. Bilateral hand pain     Plan: Regards to his hands he will continue to use Voltaren gel on a daily hands up to 2 g 4 times daily.  We will try to obtain approval for supplemental injection for his right knee.  Again he has done well with the left knee supplemental injection now more than 2 years ago he had the injection.  Follow-Up Instructions: No follow-ups on file.   Orders:  Orders Placed This Encounter  Procedures   XR Knee 1-2 Views Right   No orders of the defined types were placed in this encounter.     Procedures: No procedures performed   Clinical Data: No additional findings.   Subjective: Chief Complaint  Patient presents with   Right Hand - Follow-up   Left Hand - Follow-up    HPI Jeremy Keller returns today 4 weeks follow-up of his right thumb CMC joint arthritis and left fifth finger pain/triggering.  He states that the thumb pain in the left finger pain much improved with the use of Voltaren gel.  He is asking about possible supplemental injection for his right knee.  He has had a supplemental injection in the left knee in 2020 which is giving him good results.  He is "borderline diabetic".  He is unsure really what his glucose levels are running.  He has had no new injury to the right knee.  He denies any mechanical symptoms of the right knee.  Pain is mostly along the lateral joint line.  He uses no assistive device to ambulate.  He walks for exercise.    Review of Systems See HPI otherwise negative or noncontributory.  Objective: Vital Signs: There were no vitals taken for  this visit.  Physical Exam Constitutional:      Appearance: He is normal weight. He is not ill-appearing or diaphoretic.  Pulmonary:     Effort: Pulmonary effort is normal.  Neurological:     Mental Status: He is alert and oriented to person, place, and time.  Psychiatric:        Mood and Affect: Mood normal.    Ortho Exam Right hand and has some minimal tenderness over the [facility] joint and the thumb.  Negative grind test.  No triggering.  Left hand fifth finger tenderness over the A1 pulley but no active triggering.  Right knee full range of motion.  No instability valgus varus stressing.  No abnormal warmth erythema or effusion.  He has no tenderness on palpation along medial lateral joint line.  Patellofemoral crepitus with passive range of motion right knee. Specialty Comments:  No specialty comments available.  Imaging: XR Knee 1-2 Views Right  Result Date: 09/26/2020 Right knee 2 views: No acute fracture.  Moderate lateral joint line narrowing.  Moderate patellofemoral arthritic changes.  Medial compartment overall well-preserved.  Knee is well located.    PMFS History: Patient Active Problem List   Diagnosis Date Noted  CAD 07/09/07: Cx-OM 3x15 Promus, Mid LAD50%, Large D2 90% mid. 08/11/2018   Essential hypertension 08/11/2018   Hypercholesteremia 08/11/2018   Hyperglycemia 08/11/2018   Claudication in peripheral vascular disease (HCC) 08/11/2018   Pain in left hip 07/09/2017   Trochanteric bursitis, left hip 04/10/2016   Gout    Past Medical History:  Diagnosis Date   Coronary artery disease    Diabetes mellitus without complication (HCC)    Gout    Myocardial infarct (HCC)     Family History  Problem Relation Age of Onset   Lung cancer Brother    Heart attack Sister     Past Surgical History:  Procedure Laterality Date   CARDIAC CATHETERIZATION     COLONOSCOPY     LOWER EXTREMITY ANGIOGRAPHY N/A 11/03/2018   Procedure: LOWER EXTREMITY ANGIOGRAPHY;  Surgeon:  Yates Decamp, MD;  Location: [facility] IN[facility]IVE [facility];  Service: Cardiovascular;  Laterality: N/A;   LOWER EXTREMITY ANGIOGRAPHY Left 12/15/2018   Procedure: LOWER EXTREMITY ANGIOGRAPHY;  Surgeon: Elder Negus, MD;  Location: [facility];  Service: Cardiovascular;  Laterality: Left;   PERIPHERAL VASCULAR ATHERECTOMY  11/03/2018   Procedure: PERIPHERAL VASCULAR ATHERECTOMY;  Surgeon: Yates Decamp, MD;  Location: [facility] INVASIVE CV LAB;  Service: Cardiovascular;;   PERIPHERAL VASCULAR BALLOON ANGIOPLASTY  11/03/2018   Procedure: PERIPHERAL VASCULAR BALLOON ANGIOPLASTY;  Surgeon: Yates Decamp, MD;  Location: [facility];  Service: Cardiovascular;;   PERIPHERAL VASCULAR INTERVENTION  12/15/2018   Procedure: PERIPHERAL VASCULAR INTERVENTION;  Surgeon: Elder Negus, MD;  Location: [facility];  Service: Cardiovascular;;  left common iliac   Social History   Occupational History   Not on file  Tobacco Use   Smoking status: Former    Packs/day: 1.00    Years: 12.00    Pack years: 12.00    Types: Cigarettes    Quit date: 2007    Years since quitting: 15.4   Smokeless tobacco: Never   Tobacco comments:    quit in 2002  Vaping Use   Vaping Use: Never used  Substance and Sexual Activity   Alcohol use: Yes    Alcohol/week: 1.0 standard drink    Types: 1 Glasses of wine per week    Comment: occasional   Drug use: No   Sexual activity: Not on file

## 2020-09-27 ENCOUNTER — Other Ambulatory Visit: Payer: Self-pay

## 2020-09-27 ENCOUNTER — Telehealth: Payer: Self-pay

## 2020-09-27 NOTE — Telephone Encounter (Signed)
Right knee gel injection  

## 2020-09-27 NOTE — Telephone Encounter (Signed)
Noted  

## 2020-10-13 ENCOUNTER — Telehealth: Payer: Self-pay

## 2020-10-13 NOTE — Telephone Encounter (Signed)
VOB submitted for SynviscOne, right knee. Pending BV. 

## 2020-10-24 ENCOUNTER — Telehealth: Payer: Self-pay

## 2020-10-24 NOTE — Telephone Encounter (Signed)
Approved for SynviscOne, right knee. Buy & Bill Patient will be responsible for 20% OOP. No Co-pay No PA required  Appt. 11/09/2020 with Dr. Magnus Ivan

## 2020-11-09 ENCOUNTER — Encounter: Payer: Self-pay | Admitting: Orthopaedic Surgery

## 2020-11-09 ENCOUNTER — Ambulatory Visit (INDEPENDENT_AMBULATORY_CARE_PROVIDER_SITE_OTHER): Payer: Medicare Other | Admitting: Orthopaedic Surgery

## 2020-11-09 DIAGNOSIS — M1711 Unilateral primary osteoarthritis, right knee: Secondary | ICD-10-CM | POA: Diagnosis not present

## 2020-11-09 MED ORDER — HYLAN G-F 20 48 MG/6ML IX SOSY
48.0000 mg | PREFILLED_SYRINGE | INTRA_ARTICULAR | Status: AC | PRN
  Administered 2020-11-09: 48 mg via INTRA_ARTICULAR

## 2020-11-09 NOTE — Progress Notes (Signed)
   Procedure Note  Patient: Jeremy Keller             Date of Birth: [birth year]           MRN: [id number]             Visit Date: 11/09/2020  Procedures: Visit Diagnoses:  1. Primary osteoarthritis of right knee     Large Joint Inj: R knee on 11/09/2020 2:04 PM Indications: pain and diagnostic evaluation Details: 22 G 1.5 in needle, superolateral approach  Arthrogram: No  Medications: 48 mg Hylan 48 MG/6ML Outcome: tolerated well, no immediate complications Procedure, treatment alternatives, risks and benefits explained, specific risks discussed. Consent was given by the patient. Immediately prior to procedure a time out was called to verify the correct patient, procedure, equipment, support staff and site/side marked as required. Patient was prepped and draped in the usual sterile fashion.   The patient is a very pleasant 80 year old gentleman who comes in for a hyaluronic acid injection in his right knee with Synvisc 1 to treat the pain from osteoarthritis.  He has tried and failed other forms of conservative treatment including steroid injections.  He does have moderate arthritis of his right knee that is definitely affecting his mobility, his quality of life and his actives daily living.  He understands why he is having an injection like this in however hoping this will help decrease the pain in his right knee.  He has no right knee effusion today.  He does report a recent left knee effusion that is completely resolved.  The right knee is bothersome with weightbearing.  I did place Synvisc 1 in the right knee today without difficulty.  All questions and concerns were answered and addressed.  Follow-up can be as needed.
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
## 2020-11-21 DIAGNOSIS — E7800 Pure hypercholesterolemia, unspecified: Secondary | ICD-10-CM | POA: Diagnosis not present

## 2020-11-21 DIAGNOSIS — I2510 Atherosclerotic heart disease of native coronary artery without angina pectoris: Secondary | ICD-10-CM | POA: Diagnosis not present

## 2020-11-21 DIAGNOSIS — I252 Old myocardial infarction: Secondary | ICD-10-CM | POA: Diagnosis not present

## 2020-11-21 DIAGNOSIS — M1990 Unspecified osteoarthritis, unspecified site: Secondary | ICD-10-CM | POA: Diagnosis not present

## 2020-11-21 DIAGNOSIS — I10 Essential (primary) hypertension: Secondary | ICD-10-CM | POA: Diagnosis not present

## 2020-11-21 DIAGNOSIS — K219 Gastro-esophageal reflux disease without esophagitis: Secondary | ICD-10-CM | POA: Diagnosis not present

## 2020-11-21 DIAGNOSIS — E1140 Type 2 diabetes mellitus with diabetic neuropathy, unspecified: Secondary | ICD-10-CM | POA: Diagnosis not present

## 2020-11-28 DIAGNOSIS — H8110 Benign paroxysmal vertigo, unspecified ear: Secondary | ICD-10-CM | POA: Diagnosis not present

## 2020-11-28 DIAGNOSIS — Z1389 Encounter for screening for other disorder: Secondary | ICD-10-CM | POA: Diagnosis not present

## 2020-11-28 DIAGNOSIS — I739 Peripheral vascular disease, unspecified: Secondary | ICD-10-CM | POA: Diagnosis not present

## 2020-11-28 DIAGNOSIS — Z0000 Encounter for general adult medical examination without abnormal findings: Secondary | ICD-10-CM | POA: Diagnosis not present

## 2020-11-28 DIAGNOSIS — I252 Old myocardial infarction: Secondary | ICD-10-CM | POA: Diagnosis not present

## 2020-11-28 DIAGNOSIS — I10 Essential (primary) hypertension: Secondary | ICD-10-CM | POA: Diagnosis not present

## 2020-11-28 DIAGNOSIS — I2510 Atherosclerotic heart disease of native coronary artery without angina pectoris: Secondary | ICD-10-CM | POA: Diagnosis not present

## 2020-11-28 DIAGNOSIS — E1140 Type 2 diabetes mellitus with diabetic neuropathy, unspecified: Secondary | ICD-10-CM | POA: Diagnosis not present

## 2020-11-28 DIAGNOSIS — M109 Gout, unspecified: Secondary | ICD-10-CM | POA: Diagnosis not present

## 2020-12-22 ENCOUNTER — Other Ambulatory Visit: Payer: Self-pay | Admitting: Cardiology

## 2020-12-22 DIAGNOSIS — I10 Essential (primary) hypertension: Secondary | ICD-10-CM

## 2020-12-23 ENCOUNTER — Other Ambulatory Visit: Payer: Self-pay | Admitting: Cardiology

## 2020-12-23 DIAGNOSIS — I10 Essential (primary) hypertension: Secondary | ICD-10-CM

## 2020-12-27 DIAGNOSIS — H5203 Hypermetropia, bilateral: Secondary | ICD-10-CM | POA: Diagnosis not present

## 2020-12-27 DIAGNOSIS — H52203 Unspecified astigmatism, bilateral: Secondary | ICD-10-CM | POA: Diagnosis not present

## 2020-12-27 DIAGNOSIS — E119 Type 2 diabetes mellitus without complications: Secondary | ICD-10-CM | POA: Diagnosis not present

## 2020-12-27 DIAGNOSIS — H2513 Age-related nuclear cataract, bilateral: Secondary | ICD-10-CM | POA: Diagnosis not present

## 2021-01-18 DIAGNOSIS — R350 Frequency of micturition: Secondary | ICD-10-CM | POA: Diagnosis not present

## 2021-04-10 DIAGNOSIS — I10 Essential (primary) hypertension: Secondary | ICD-10-CM | POA: Diagnosis not present

## 2021-04-10 DIAGNOSIS — M1990 Unspecified osteoarthritis, unspecified site: Secondary | ICD-10-CM | POA: Diagnosis not present

## 2021-04-10 DIAGNOSIS — I2510 Atherosclerotic heart disease of native coronary artery without angina pectoris: Secondary | ICD-10-CM | POA: Diagnosis not present

## 2021-04-10 DIAGNOSIS — E1140 Type 2 diabetes mellitus with diabetic neuropathy, unspecified: Secondary | ICD-10-CM | POA: Diagnosis not present

## 2021-04-10 DIAGNOSIS — E7800 Pure hypercholesterolemia, unspecified: Secondary | ICD-10-CM | POA: Diagnosis not present

## 2021-05-17 ENCOUNTER — Other Ambulatory Visit: Payer: Self-pay

## 2021-05-17 ENCOUNTER — Ambulatory Visit (INDEPENDENT_AMBULATORY_CARE_PROVIDER_SITE_OTHER): Payer: Medicare Other | Admitting: Podiatry

## 2021-05-17 DIAGNOSIS — L03032 Cellulitis of left toe: Secondary | ICD-10-CM | POA: Diagnosis not present

## 2021-05-17 DIAGNOSIS — M79675 Pain in left toe(s): Secondary | ICD-10-CM

## 2021-05-17 DIAGNOSIS — M79674 Pain in right toe(s): Secondary | ICD-10-CM | POA: Diagnosis not present

## 2021-05-17 DIAGNOSIS — B351 Tinea unguium: Secondary | ICD-10-CM

## 2021-05-17 NOTE — Patient Instructions (Signed)
Soak Instructions    THE DAY AFTER THE PROCEDURE  Place 1/4 cup of epsom salts (or betadine, or white vinegar) in a quart of warm tap water.  Submerge your foot or feet with outer bandage intact for the initial soak; this will allow the bandage to become moist and wet for easy lift off.  Once you remove your bandage, continue to soak in the solution for 20 minutes.  This soak should be done once a day.  Next, remove your foot or feet from solution, blot dry the affected area and cover.  You may use a band aid large enough to cover the area or use gauze and tape.  Apply other medications to the area as directed by the doctor such as polysporin neosporin.  IF YOUR SKIN BECOMES IRRITATED WHILE USING THESE INSTRUCTIONS, IT IS OKAY TO SWITCH TO  WHITE VINEGAR AND WATER. Or you may use antibacterial soap and water to keep the toe clean  Monitor for any signs/symptoms of infection. Call the office immediately if any occur or go directly to the emergency room. Call with any questions/concerns.     Change the dressing every day after soaks, apply the ointment and a bandage

## 2021-05-21 ENCOUNTER — Encounter: Payer: Self-pay | Admitting: Podiatry

## 2021-05-21 NOTE — Progress Notes (Signed)
°  °  Subjective:  Patient ID: Jeremy Keller, male    DOB: 12-08-1940,  MRN: [id number]  Chief Complaint  Patient presents with   Nail Problem    Routine foot care    81 y.o. male presents with the above complaint. History confirmed with patient.  Stop using the Penlac he feels it made his nails worse.  Objective:  Physical Exam: warm, good capillary refill, no trophic changes or ulcerative lesions, normal DP and PT pulses and normal sensory exam.  Thickened elongated yellow-brown discolored nails x10 Left Foot: Hallux nail has small serous blister deep to the nail plate with small ulceration  Assessment:   1. Pain due to onychomycosis of toenails of both feet   2. Paronychia of great toe of left foot      Plan:  Patient was evaluated and treated and all questions answered.  Discussed the etiology and treatment options for the condition in detail with the patient. Educated patient on the topical and oral treatment options for mycotic nails. Recommended debridement of the nails today. Sharp and mechanical debridement performed of all painful and mycotic nails today. Nails debrided in length and thickness using a nail nipper and a mechanical burr to level of comfort. Discussed treatment options including appropriate shoe gear. Follow up as needed for painful nails.  Discontinue Penlac.  His left hallux nail had a small ulceration with a blister underneath the nailbed.  Would like to see him back in a few weeks for reevaluation.  If not improving would recommend we reevaluate his vascular testing.  Apply Iodosorb ointment daily.  This was dispensed today to him  Return in about 3 weeks (around 06/07/2021) for nail re-check.

## 2021-05-29 DIAGNOSIS — I10 Essential (primary) hypertension: Secondary | ICD-10-CM | POA: Diagnosis not present

## 2021-05-29 DIAGNOSIS — M109 Gout, unspecified: Secondary | ICD-10-CM | POA: Diagnosis not present

## 2021-05-29 DIAGNOSIS — M792 Neuralgia and neuritis, unspecified: Secondary | ICD-10-CM | POA: Diagnosis not present

## 2021-05-29 DIAGNOSIS — E7800 Pure hypercholesterolemia, unspecified: Secondary | ICD-10-CM | POA: Diagnosis not present

## 2021-05-29 DIAGNOSIS — K219 Gastro-esophageal reflux disease without esophagitis: Secondary | ICD-10-CM | POA: Diagnosis not present

## 2021-05-29 DIAGNOSIS — I739 Peripheral vascular disease, unspecified: Secondary | ICD-10-CM | POA: Diagnosis not present

## 2021-05-29 DIAGNOSIS — I2510 Atherosclerotic heart disease of native coronary artery without angina pectoris: Secondary | ICD-10-CM | POA: Diagnosis not present

## 2021-05-29 DIAGNOSIS — E1140 Type 2 diabetes mellitus with diabetic neuropathy, unspecified: Secondary | ICD-10-CM | POA: Diagnosis not present

## 2021-05-29 DIAGNOSIS — I252 Old myocardial infarction: Secondary | ICD-10-CM | POA: Diagnosis not present

## 2021-06-05 ENCOUNTER — Ambulatory Visit: Payer: Medicare Other | Admitting: Podiatry

## 2021-07-26 ENCOUNTER — Other Ambulatory Visit: Payer: Self-pay | Admitting: Cardiology

## 2021-07-26 DIAGNOSIS — I10 Essential (primary) hypertension: Secondary | ICD-10-CM

## 2021-08-13 ENCOUNTER — Ambulatory Visit: Payer: Medicare Other | Admitting: Cardiology

## 2021-08-13 ENCOUNTER — Encounter: Payer: Self-pay | Admitting: Cardiology

## 2021-08-13 VITALS — BP 125/73 | HR 73 | Temp 98.0°F | Resp 16 | Ht 72.0 in | Wt 207.0 lb

## 2021-08-13 DIAGNOSIS — E7800 Pure hypercholesterolemia, unspecified: Secondary | ICD-10-CM | POA: Diagnosis not present

## 2021-08-13 DIAGNOSIS — I10 Essential (primary) hypertension: Secondary | ICD-10-CM

## 2021-08-13 DIAGNOSIS — I739 Peripheral vascular disease, unspecified: Secondary | ICD-10-CM

## 2021-08-13 DIAGNOSIS — R0609 Other forms of dyspnea: Secondary | ICD-10-CM | POA: Diagnosis not present

## 2021-08-13 DIAGNOSIS — I2510 Atherosclerotic heart disease of native coronary artery without angina pectoris: Secondary | ICD-10-CM

## 2021-08-13 MED ORDER — CILOSTAZOL 50 MG PO TABS: 50.0000 mg | ORAL_TABLET | Freq: Two times a day (BID) | ORAL | 2 refills | Status: DC

## 2021-08-13 NOTE — Progress Notes (Signed)
? ?Primary Physician/Referring:  Wenda Low, MD ? ?Patient ID: Jeremy Keller, male    DOB: 08/08/1940, 81 y.o.   MRN: [id number] ? ?Chief Complaint  ?Patient presents with  ? Coronary Artery Disease  ? PAD  ? Hypertension  ? Hyperlipidemia  ? Follow-up  ?  1 year  ? ? ?HPI: [name]  is a 81 y.o. male  with coronary artery disease and angioplasty and stenting to his circumflex coronary artery in 2009,  prior tobacco use disorder, he quit smoking in 2007, probably 10-pack-year history, hypertension, DM, hyperlipidemia and hyperglycemia. H/O PAD with claudication underwent staged intervention to right mid and distal SFA using Hawk One directional atherectomy on 11/03/2018 and also left common iliac artery stenting on 12/15/2018.   ? ?This is annual visit, has noticed dyspnea on exertion that is new to him.  No PND or orthopnea.  No leg edema.  He has also noticed that with activity bilateral calves feels tingling and numbness and sometimes weakness and discomfort.  No ulcerations. ? ?Past Medical History:  ?Diagnosis Date  ? Coronary artery disease   ? Diabetes mellitus without complication ([place])   ? Gout   ? Myocardial infarct [facility])   ? ? ?Past Surgical History:  ?Procedure Laterality Date  ? CARDIAC CATHETERIZATION    ? COLONOSCOPY    ? LOWER EXTREMITY ANGIOGRAPHY N/A 11/03/2018  ? Procedure: LOWER EXTREMITY ANGIOGRAPHY;  Surgeon: Adrian Prows, MD;  Location: [place] CV LAB;  Service: Cardiovascular;  Laterality: N/A;  ? LOWER EXTREMITY ANGIOGRAPHY Left 12/15/2018  ? Procedure: LOWER EXTREMITY ANGIOGRAPHY;  Surgeon: Nigel Mormon, MD;  Location: [place] CV LAB;  Service: Cardiovascular;  Laterality: Left;  ? PERIPHERAL VASCULAR ATHERECTOMY  11/03/2018  ? Procedure: PERIPHERAL VASCULAR ATHERECTOMY;  Surgeon: Adrian Prows, MD;  Location: [place] CV LAB;  Service: Cardiovascular;;  ? PERIPHERAL VASCULAR BALLOON ANGIOPLASTY  11/03/2018  ? Procedure: PERIPHERAL VASCULAR BALLOON ANGIOPLASTY;  Surgeon: Adrian Prows, MD;  Location: [place] CV LAB;  Service: Cardiovascular;;  ? PERIPHERAL VASCULAR INTERVENTION  12/15/2018  ? Procedure: PERIPHERAL VASCULAR INTERVENTION;  Surgeon: Nigel Mormon, MD;  Location: [place] CV LAB;  Service: Cardiovascular;;  left common iliac  ? ?Social History  ? ?Tobacco Use  ? Smoking status: Former  ?  Packs/day: 1.00  ?  Years: 12.00  ?  Pack years: 12.00  ?  Types: Cigarettes  ?  Quit date: 2007  ?  Years since quitting: 16.3  ? Smokeless tobacco: Never  ? Tobacco comments:  ?  quit in 2002  ?Substance Use Topics  ? Alcohol use: Yes  ?  Alcohol/week: 1.0 standard drink  ?  Types: 1 Glasses of wine per week  ?  Comment: occasional  ?Marital Status: Divorced  ? ?Review of Systems  ?Cardiovascular:  Positive for claudication and dyspnea on exertion. Negative for chest pain and leg swelling.  ?Gastrointestinal:  Negative for melena.   ?Objective  ?Blood pressure 125/73, pulse 73, temperature 98 ?F (36.7 ?C), temperature source Temporal, resp. rate 16, height 6' (1.829 m), weight 207 lb (93.9 kg), SpO2 98 %. Body mass index is 28.07 kg/m?. ? ?  08/13/2021  ? 10:28 AM 08/11/2020  ? 12:45 PM 01/22/2020  ? 11:16 AM  ?Vitals with BMI  ?Height _0  _1    ?Weight 207 lbs 206 lbs   ?BMI 28.07 27.93   ?Systolic 188 416 606  ?Diastolic 73 63 93  ?Pulse 73 71 74  ?  ?  Physical Exam ?Constitutional:   ?   Appearance: He is well-developed.  ?Neck:  ?   Vascular: No carotid bruit or JVD.  ?Cardiovascular:  ?   Rate and Rhythm: Normal rate and regular rhythm.  ?   Pulses: Decreased pulses.     ?     Carotid pulses are 2+ on the right side and 2+ on the left side. ?     Femoral pulses are 2+ on the right side and 1+ on the left side. ?     Popliteal pulses are 2+ on the right side and 2+ on the left side.  ?     Dorsalis pedis pulses are 0 on the right side and 1+ on the left side.  ?     Posterior tibial pulses are 1+ on the right side and 1+ on the left side.  ?   Heart sounds: Normal heart  sounds. No murmur heard. ?  No gallop.  ?   Comments: Bilateral onychomycosis present ?Pulmonary:  ?   Effort: Pulmonary effort is normal.  ?   Breath sounds: Normal breath sounds.  ?Abdominal:  ?   General: Bowel sounds are normal.  ?   Palpations: Abdomen is soft.  ?   Comments: Ventral hernia noted. Reducible  ?Musculoskeletal:  ?   Right lower leg: No edema.  ?   Left lower leg: No edema.  ?   ?Radiology: ?No results found. ? ?Laboratory examination:  ? ?External Labs   ?Labs 05/29/2021: ? ?A1c 6.0%. ? ?Hb 14.8/HCT 43.8, platelets 128, normal indicis. ? ?BUN 12, creatinine 0.93, EGFR 83 mL, potassium 4.3, LFTs normal. ? ?Labs 11/28/2020: ? ?Total cholesterol 116, triglycerides 178, HDL 47, LDL 48.  ? ?Allergies and Medications  ? ?Allergies  ?Allergen Reactions  ? Metformin   ?  Other reaction(s): GI; diarrhrea  ?  ? ?Current Outpatient Medications:  ?  allopurinol (ZYLOPRIM) 100 MG tablet, Take 100 mg by mouth 2 (two) times daily., Disp: , Rfl: 0 ?  amLODipine (NORVASC) 10 MG tablet, TAKE 1 TABLET(10 MG) BY MOUTH DAILY, Disp: 90 tablet, Rfl: 3 ?  aspirin EC 81 MG tablet, Take 162 mg by mouth daily. , Disp: , Rfl:  ?  atorvastatin (LIPITOR) 20 MG tablet, Take 20 mg by mouth daily. , Disp: , Rfl:  ?  Blood Glucose Monitoring Suppl (ONETOUCH VERIO REFLECT) w/Device KIT, See admin instructions., Disp: , Rfl:  ?  cilostazol (PLETAL) 50 MG tablet, Take 1 tablet (50 mg total) by mouth 2 (two) times daily., Disp: 60 tablet, Rfl: 2 ?  diclofenac Sodium (VOLTAREN) 1 % GEL, See admin instructions., Disp: , Rfl:  ?  gabapentin (NEURONTIN) 100 MG capsule, Take 100 mg by mouth at bedtime. , Disp: , Rfl:  ?  glimepiride (AMARYL) 1 MG tablet, Take 1 mg by mouth daily with breakfast., Disp: , Rfl:  ?  glucose blood (ONETOUCH VERIO) test strip, See admin instructions., Disp: , Rfl:  ?  labetalol (NORMODYNE) 100 MG tablet, TAKE 1 TABLET(100 MG) BY MOUTH TWICE DAILY, Disp: 180 tablet, Rfl: 1 ?  Lancets (ONETOUCH DELICA PLUS  [id number]) MISC, Apply topically 2 (two) times daily., Disp: , Rfl:  ?  losartan (COZAAR) 100 MG tablet, Take 100 mg by mouth daily., Disp: , Rfl: 0 ?  ONETOUCH VERIO test strip, 2 (two) times daily., Disp: , Rfl:  ?  pantoprazole (PROTONIX) 40 MG tablet, Take 40 mg by mouth daily., Disp: , Rfl:  ?  tamsulosin (  FLOMAX) 0.4 MG CAPS capsule, Take 0.4 mg by mouth daily., Disp: , Rfl:   ?  ?Cardiac Studies:  ? ?Abdominal aortic duplex 03/19/2017: The maximum aorta diameter is 2.76 cm (dist). Diffuse plaque observed in the mid and distal aorta. Normal flow velocities noted in the aorta and iliac arteries. No AAA noted. ? ?Coronary angiogram 07/09/2007: Stenting of OM branch of circumflex with 3.0 x 15 mm Promus DES.  Left main ostial 20%, mid LAD 50% stenosis.  Diagonal 2, large has mid 90% stenosis. ? ?Exercise sestamibi stress test 11/07/2014: ?1. Resting EKG demonstrates normal sinus rhythm, stress EKG was negative for myocardial ischemia.  Occasional PVCs with stress EKG. The patient performed treadmill exercise using a Bruce protocol, completing 5:01 minutes. The patient completed an estimated workload of 7.03 METS, 100% of the maximum predicted heart rate. Stress symptoms included dyspnea. ?2. The perfusion imaging study demonstrates a very small-sized apical inferolateral and a small sized apical-lateral ischemia of mild intensity.  Left ventricle systolic function calculated by QGS was mildly depressed at 49%. Overall the defect is small sized and mild in intensity, low risk stress test.  The defect probably correlates with the diagonal stenosis and may also represent in-stent restenosis in the circumflex coronary artery.  Clinical correlation is recommended. ? ?Lower Extremity Arterial Duplex 09/14/2018: ?Moderate velocity increase at the right distal superficial femoral artery consistent with >50% stenosis. ?No hemodynamically significant stenoses are identified in the left lower extremity arterial  system. ?Diffuse disease bilateral lower extremities with biphasic waveform. ?This exam reveals moderately decreased perfusion of the lower extremity, noted at the post tibial artery level (ABI). Bilateral ABI 0.75 with mild

## 2021-08-21 DIAGNOSIS — I10 Essential (primary) hypertension: Secondary | ICD-10-CM | POA: Diagnosis not present

## 2021-08-21 DIAGNOSIS — E1140 Type 2 diabetes mellitus with diabetic neuropathy, unspecified: Secondary | ICD-10-CM | POA: Diagnosis not present

## 2021-08-21 DIAGNOSIS — I2510 Atherosclerotic heart disease of native coronary artery without angina pectoris: Secondary | ICD-10-CM | POA: Diagnosis not present

## 2021-08-21 DIAGNOSIS — K219 Gastro-esophageal reflux disease without esophagitis: Secondary | ICD-10-CM | POA: Diagnosis not present

## 2021-08-21 DIAGNOSIS — E7800 Pure hypercholesterolemia, unspecified: Secondary | ICD-10-CM | POA: Diagnosis not present

## 2021-08-23 ENCOUNTER — Ambulatory Visit: Payer: Medicare Other

## 2021-08-23 DIAGNOSIS — I2510 Atherosclerotic heart disease of native coronary artery without angina pectoris: Secondary | ICD-10-CM | POA: Diagnosis not present

## 2021-08-23 DIAGNOSIS — R0609 Other forms of dyspnea: Secondary | ICD-10-CM

## 2021-08-23 DIAGNOSIS — I739 Peripheral vascular disease, unspecified: Secondary | ICD-10-CM | POA: Diagnosis not present

## 2021-08-29 ENCOUNTER — Ambulatory Visit: Payer: Medicare Other

## 2021-08-29 DIAGNOSIS — I2510 Atherosclerotic heart disease of native coronary artery without angina pectoris: Secondary | ICD-10-CM

## 2021-08-29 DIAGNOSIS — R0609 Other forms of dyspnea: Secondary | ICD-10-CM

## 2021-08-30 ENCOUNTER — Ambulatory Visit (INDEPENDENT_AMBULATORY_CARE_PROVIDER_SITE_OTHER): Payer: Medicare Other | Admitting: Podiatry

## 2021-08-30 DIAGNOSIS — B351 Tinea unguium: Secondary | ICD-10-CM

## 2021-08-30 DIAGNOSIS — M79674 Pain in right toe(s): Secondary | ICD-10-CM

## 2021-08-30 DIAGNOSIS — M79675 Pain in left toe(s): Secondary | ICD-10-CM

## 2021-08-30 NOTE — Progress Notes (Signed)
Normal/low risk stress test

## 2021-08-30 NOTE — Progress Notes (Signed)
Called and spoke with patient regarding his stress test results.

## 2021-09-03 NOTE — Progress Notes (Signed)
    Subjective:  Patient ID: Jeremy Keller, male    DOB: 1940/09/12,  MRN: [id number]  Chief Complaint  Patient presents with   nail re-check    Patient is here for  3 week nail re-check.    81 y.o. male presents with the above complaint. History confirmed with patient.  Nails are thickened elongated and causing discomfort again.  Objective:  Physical Exam: warm, good capillary refill, no trophic changes or ulcerative lesions, normal DP and PT pulses and normal sensory exam.  Thickened elongated yellow-brown discolored nails x10   Assessment:   1. Pain due to onychomycosis of toenails of both feet      Plan:  Patient was evaluated and treated and all questions answered.  Discussed the etiology and treatment options for the condition in detail with the patient. Educated patient on the topical and oral treatment options for mycotic nails. Recommended debridement of the nails today. Sharp and mechanical debridement performed of all painful and mycotic nails today. Nails debrided in length and thickness using a nail nipper and a mechanical burr to level of comfort. Discussed treatment options including appropriate shoe gear. Follow up as needed for painful nails.   Return in about 3 months (around 11/30/2021) for at risk diabetic foot care.

## 2021-09-16 ENCOUNTER — Other Ambulatory Visit: Payer: Self-pay | Admitting: Cardiology

## 2021-09-26 ENCOUNTER — Encounter: Payer: Self-pay | Admitting: Cardiology

## 2021-09-26 ENCOUNTER — Ambulatory Visit: Payer: Medicare Other | Admitting: Cardiology

## 2021-09-26 VITALS — BP 106/63 | HR 79 | Temp 98.3°F | Resp 17 | Ht 72.0 in | Wt 207.8 lb

## 2021-09-26 DIAGNOSIS — I10 Essential (primary) hypertension: Secondary | ICD-10-CM | POA: Diagnosis not present

## 2021-09-26 DIAGNOSIS — I2510 Atherosclerotic heart disease of native coronary artery without angina pectoris: Secondary | ICD-10-CM

## 2021-09-26 DIAGNOSIS — R0609 Other forms of dyspnea: Secondary | ICD-10-CM | POA: Diagnosis not present

## 2021-09-26 DIAGNOSIS — I739 Peripheral vascular disease, unspecified: Secondary | ICD-10-CM

## 2021-09-26 NOTE — Progress Notes (Signed)
Primary Physician/Referring:  Georgann Housekeeper, MD  Patient ID: Jeremy Keller, male    DOB: March 08, 1941, 81 y.o.   MRN: [id number]  Chief Complaint  Patient presents with   Shortness of Breath   Coronary Artery Disease   PAD    6 WEEKS    HPI: Jeremy Keller  is a 81 y.o. male  with coronary artery disease and angioplasty and stenting to his circumflex coronary artery in 2009,  prior tobacco use disorder, he quit smoking in 2007, probably 10-pack-year history, hypertension, DM, hyperlipidemia and hyperglycemia. H/O PAD with claudication underwent staged intervention to right mid and distal SFA using Hawk One directional atherectomy on 11/03/2018 and also left common iliac artery stenting on 12/15/2018.    I had seen him 6 weeks ago and he had mentioned dyspnea on exertion, and worsening bilateral legs tingling and numbness and weakness and discomfort he underwent stress testing and also lower extremity arterial duplex and presents for follow-up.  I had started him on Pletal 50 mg twice daily and discontinue Plavix, he has not noticed any difference in symptoms.   Past Medical History:  Diagnosis Date   Coronary artery disease    Diabetes mellitus without complication (HCC)    Gout    Myocardial infarct [facility])     Past Surgical History:  Procedure Laterality Date   CARDIAC CATHETERIZATION     COLONOSCOPY     LOWER EXTREMITY ANGIOGRAPHY N/A 11/03/2018   Procedure: LOWER EXTREMITY ANGIOGRAPHY;  Surgeon: Yates Decamp, MD;  Location: [facility] INVASIVE CV [facility];  Service: Cardiovascular;  Laterality: N/A;   LOWER EXTREMITY ANGIOGRAPHY Left 12/15/2018   Procedure: LOWER EXTREMITY ANGIOGRAPHY;  Surgeon: Elder Negus, MD;  Location: [facility] CV [facility];  Service: Cardiovascular;  Laterality: Left;   PERIPHERAL VASCULAR ATHERECTOMY  11/03/2018   Procedure: PERIPHERAL VASCULAR ATHERECTOMY;  Surgeon: Yates Decamp, MD;  Location: [facility] INVASIVE CV LAB;  Service: Cardiovascular;;   PERIPHERAL VASCULAR BALLOON  ANGIOPLASTY  11/03/2018   Procedure: PERIPHERAL VASCULAR BALLOON ANGIOPLASTY;  Surgeon: Yates Decamp, MD;  Location: [facility] CV [facility];  Service: Cardiovascular;;   PERIPHERAL VASCULAR INTERVENTION  12/15/2018   Procedure: PERIPHERAL VASCULAR INTERVENTION;  Surgeon: Elder Negus, MD;  Location: [facility];  Service: Cardiovascular;;  left common iliac   Social History   Tobacco Use   Smoking status: Former    Packs/day: 1.00    Years: 12.00    Total pack years: 12.00    Types: Cigarettes    Quit date: 2007    Years since quitting: 16.4   Smokeless tobacco: Never   Tobacco comments:    quit in 2002  Substance Use Topics   Alcohol use: Yes    Alcohol/week: 1.0 standard drink of alcohol    Types: 1 Glasses of wine per week    Comment: occasional  Marital Status: Divorced   Review of Systems  Cardiovascular:  Positive for claudication and dyspnea on exertion. Negative for chest pain and leg swelling.  Gastrointestinal:  Negative for melena.    Objective  Blood pressure 106/63, pulse 79, temperature 98.3 F (36.8 C), temperature source Temporal, resp. rate 17, height 6' (1.829 m), weight 207 lb 12.8 oz (94.3 kg), SpO2 94 %. Body mass index is 28.18 kg/m.    09/26/2021   11:03 AM 08/13/2021   10:28 AM 08/11/2020   12:45 PM  Vitals with BMI  Height 6\' 0"  6\' 0"  6\' 0"   Weight 207 lbs 13 oz 207 lbs  206 lbs  BMI 28.18 31.51 76.16  Systolic 073 710 626  Diastolic 63 73 63  Pulse 79 73 71      Physical Exam Constitutional:      Appearance: He is well-developed.  Neck:     Vascular: No carotid bruit or JVD.  Cardiovascular:     Rate and Rhythm: Normal rate and regular rhythm.     Pulses: Decreased pulses.          Carotid pulses are 2+ on the right side and 2+ on the left side.      Femoral pulses are 2+ on the right side and 1+ on the left side.      Popliteal pulses are 2+ on the right side and 2+ on the left side.       Dorsalis pedis pulses are 0 on the right  side and 1+ on the left side.       Posterior tibial pulses are 1+ on the right side and 1+ on the left side.     Heart sounds: Normal heart sounds. No murmur heard.    No gallop.     Comments: Bilateral onychomycosis present Pulmonary:     Effort: Pulmonary effort is normal.     Breath sounds: Normal breath sounds.  Abdominal:     General: Bowel sounds are normal.     Palpations: Abdomen is soft.     Comments: Ventral hernia noted. Reducible  Musculoskeletal:     Right lower leg: No edema.     Left lower leg: No edema.      Laboratory examination:   External Labs   Labs 05/29/2021:  A1c 6.0%.  Hb 14.8/HCT 43.8, platelets 128, normal indicis.  BUN 12, creatinine 0.93, EGFR 83 mL, potassium 4.3, LFTs normal.  Labs 11/28/2020:  Total cholesterol 116, triglycerides 178, HDL 47, LDL 48.   Allergies   Allergies  Allergen Reactions   Metformin     Other reaction(s): GI; diarrhrea    Final Medications at End of Visit    Current Outpatient Medications:    allopurinol (ZYLOPRIM) 100 MG tablet, Take 100 mg by mouth 2 (two) times daily., Disp: , Rfl: 0   amLODipine (NORVASC) 10 MG tablet, TAKE 1 TABLET(10 MG) BY MOUTH DAILY, Disp: 90 tablet, Rfl: 3   aspirin EC 81 MG tablet, Take 162 mg by mouth daily. , Disp: , Rfl:    atorvastatin (LIPITOR) 20 MG tablet, Take 20 mg by mouth daily. , Disp: , Rfl:    Blood Glucose Monitoring Suppl (ONETOUCH VERIO REFLECT) w/Device KIT, See admin instructions., Disp: , Rfl:    diclofenac Sodium (VOLTAREN) 1 % GEL, See admin instructions., Disp: , Rfl:    gabapentin (NEURONTIN) 100 MG capsule, Take 100 mg by mouth at bedtime. , Disp: , Rfl:    glimepiride (AMARYL) 1 MG tablet, Take 1 mg by mouth daily with breakfast., Disp: , Rfl:    glucose blood (ONETOUCH VERIO) test strip, See admin instructions., Disp: , Rfl:    labetalol (NORMODYNE) 100 MG tablet, TAKE 1 TABLET(100 MG) BY MOUTH TWICE DAILY, Disp: 180 tablet, Rfl: 1   Lancets (ONETOUCH DELICA  PLUS [id number]) MISC, Apply topically 2 (two) times daily., Disp: , Rfl:    losartan (COZAAR) 100 MG tablet, Take 100 mg by mouth daily., Disp: , Rfl: 0   meclizine (ANTIVERT) 25 MG tablet, Take 25 mg by mouth 3 (three) times daily as needed., Disp: , Rfl:    ONETOUCH VERIO test strip,  2 (two) times daily., Disp: , Rfl:    pantoprazole (PROTONIX) 40 MG tablet, Take 40 mg by mouth daily., Disp: , Rfl:    tamsulosin (FLOMAX) 0.4 MG CAPS capsule, Take 0.4 mg by mouth daily., Disp: , Rfl:     Radiology:   No results found.  Cardiac Studies:   Abdominal aortic duplex 03/19/2017: The maximum aorta diameter is 2.76 cm (dist). Diffuse plaque observed in the mid and distal aorta. Normal flow velocities noted in the aorta and iliac arteries. No AAA noted.  Coronary angiogram 07/09/2007: Stenting of OM branch of circumflex with 3.0 x 15 mm Promus DES.  Left main ostial 20%, mid LAD 50% stenosis.  Diagonal 2, large has mid 90% stenosis.  Peripheral arteriogram   - 11/03/2018: Abdominal aortogram revealing mild atherosclerotic changes, 2 renal arteries (widely patent.  Left common iliac artery has a focal 70 to 75%, approximately followed by a 30 to 40% stenosis in the left external iliac artery.  Left SFA in the mid segment has a 50 to 60% stenosis in a tandem fashion.  Left popliteal artery is diffusely diseased but patent with less than 20 to 30% stenosis.  Left anterior tibial, left peroneal is occluded, TP trunk is severely diseased around 80%, one-vessel runoff in the form of PT in the left leg below the knee.  Right common iliac, external iliac, CFA are widely patent with mild disease.  Right mid SFA 75% stenosis, right DP (and severely disease with 80% diffuse disease, right AT and peroneal artery are occluded, single-vessel involving the form of PT in the right leg below the knee. Successful atherectomy with HawkOne LS right mid to distal SFA stenosis reduced from 25% to 0% with sluggish flow  improved to brisk flow.  - 12/15/2018: Lt CIA: 90% ulcerates lesion. Distal Lt CIA: 50% lesion Mid to distal SFA: Calcific 60% tandem lesions, hemodynamically non-significant (no pressure gradient). Severe below the knee disease with one vessel runoff PT PTA and balloon expanding stent Omnilink 10.0 X 59 mm placement Left common iliac artery   Echocardiogram 08/23/2021:  Left ventricle cavity is normal in size. Mild concentric hypertrophy of the left ventricle. Normal global wall motion. Normal LV systolic function  with EF 56%. Doppler evidence of grade I (impaired) diastolic dysfunction, normal LAP.  Trileaflet aortic valve with no regurgitation. Mild aortic valve leaflet calcification.  Mild tricuspid regurgitation. Estimated pulmonary artery systolic pressure 29 mmHg.  No significant change compared to previous study in 2022.  Lower Extremity Arterial Duplex 08/23/2021: Right mid superficial femoral artery to distal superficial femoral artery atherectomy patent.  Left external iliac artery stent patent.  No hemodynamically significant stenoses are identified in the right lower extremity arterial system.  Left DFA stenosis >50%.  This exam reveals normal perfusion of the right lower extremity (ABI 1.03).  Biphasic waveform. This exam reveals normal perfusion of the left lower extremity (ABI 0.99). Biphasic waveform.  PCV MYOCARDIAL PERFUSION WO LEXISCAN 08/29/2021  Narrative Lexiscan (with Mod Bruce protocol) Nuclear stress test 08/29/2021: Nondiagnostic ECG stress. The heart rate response was consistent with Regadenoson. Myocardial perfusion is normal. Overall LV systolic function is normal without regional wall motion abnormalities. Stress LV EF: 48%.  Visually LVEF appears normal. Compared to previous study: 10/23/2018,  previously no diaphragmatic attenuation was noted and LVEF was 50%. Low risk.   EKG  EKG 08/13/2021: Normal sinus rhythm at rate of 60 bpm, left atrial  enlargement, left axis deviation, left anterior fascicular block.  Poor R  progression, cannot exclude anteroseptal infarct old.  No evidence of ischemia.  No change from 08/11/2020.   Assessment     ICD-10-CM   1. Coronary artery disease involving native coronary artery of native heart without angina pectoris  I25.10     2. Claudication in peripheral vascular disease (HCC)  I73.9     3. Dyspnea on exertion  R06.09     4. Essential hypertension  I10      No orders of the defined types were placed in this encounter.  Medications Discontinued During This Encounter  Medication Reason   cilostazol (PLETAL) 50 MG tablet Discontinued by provider     Recommendations:   Jeremy Keller  is a 81 y.o. with coronary artery disease and angioplasty and stenting to his circumflex coronary artery in 2009,  prior tobacco use disorder, he quit smoking in 2007, probably 10-pack-year history, hypertension, DM, hyperlipidemia and hyperglycemia. H/O PAD with claudication underwent staged intervention to right mid and distal SFA using Hawk One directional atherectomy on 11/03/2018 and also left common iliac artery stenting on 12/15/2018.    I had seen him 6 weeks ago and he had mentioned dyspnea on exertion, and worsening bilateral legs tingling and numbness and weakness and discomfort he underwent stress testing and also lower extremity arterial duplex and presents for follow-up.  I reviewed the results of the stress test, echocardiogram and lower EXTR arterial duplex.  Low risk stress test, preserved LVEF with no significant valvular abnormalities.  Lower extremity duplex does not reveal any significant high-grade stenosis or ABI abnormality, suspect his lower extremity discomfort is probably related to diabetes and peripheral neuropathy versus he does have spinal stenosis and has had back surgery and may be related to this.  Evaluation for pseudoclaudication is indicated.  Otherwise his risk factors are well  controlled including hypertension, hyperlipidemia and diabetes.  I had started him on Pletal on his last office visit and there is no change in his symptoms hence he can discontinue this.  Continue aspirin indefinitely.  Otherwise stable from cardiac standpoint, I will see him back in a year or sooner if problems.  Advised him to continue to remain active, he is presently 81 years of age.   Adrian Prows, MD, [facility] 09/26/2021, 10:47 PM Office: [phone] Fax: [phone] Pager: [phone]

## 2021-10-29 ENCOUNTER — Other Ambulatory Visit: Payer: Self-pay | Admitting: Cardiology

## 2021-10-29 DIAGNOSIS — I739 Peripheral vascular disease, unspecified: Secondary | ICD-10-CM

## 2021-11-15 ENCOUNTER — Ambulatory Visit (INDEPENDENT_AMBULATORY_CARE_PROVIDER_SITE_OTHER): Payer: Medicare Other | Admitting: Podiatry

## 2021-11-15 DIAGNOSIS — B351 Tinea unguium: Secondary | ICD-10-CM | POA: Diagnosis not present

## 2021-11-15 NOTE — Progress Notes (Signed)
  Subjective:  Patient ID: Jeremy Keller, male    DOB: [birth year],  MRN: [id number]  Chief Complaint  Patient presents with   Nail Problem      left great toe/toenail - patient reports that his left big toe was covered in white drainage when he took his sock off yesterday. Not sore to the touch. Due for his 3 month nail trim in a couple of weeks    81 y.o. male presents with the above complaint. History confirmed with patient.   Objective:  Physical Exam: warm, good capillary refill, no trophic changes or ulcerative lesions, normal DP and PT pulses, normal sensory exam, and he has onychomycosis of multiple toenails, no signs of purulence abscess or infection.  Assessment:   1. Onychomycosis      Plan:  Patient was evaluated and treated and all questions answered.  I debrided the left hallux nail with a sharp nail nipper, could not discern any abscess or sign of ingrowing or paronychia.  I think likely the fungal debris had macerated due to humidity within shoe gear.  Should resolve uneventfully.  Advised to do soaks for the next week with acetic acid solution with vinegar and warm water.  I will see him back at his regular scheduled visit  No follow-ups on file.

## 2021-11-29 DIAGNOSIS — I2510 Atherosclerotic heart disease of native coronary artery without angina pectoris: Secondary | ICD-10-CM | POA: Diagnosis not present

## 2021-11-29 DIAGNOSIS — K219 Gastro-esophageal reflux disease without esophagitis: Secondary | ICD-10-CM | POA: Diagnosis not present

## 2021-11-29 DIAGNOSIS — E1140 Type 2 diabetes mellitus with diabetic neuropathy, unspecified: Secondary | ICD-10-CM | POA: Diagnosis not present

## 2021-11-29 DIAGNOSIS — M1990 Unspecified osteoarthritis, unspecified site: Secondary | ICD-10-CM | POA: Diagnosis not present

## 2021-11-29 DIAGNOSIS — I10 Essential (primary) hypertension: Secondary | ICD-10-CM | POA: Diagnosis not present

## 2021-11-29 DIAGNOSIS — E7800 Pure hypercholesterolemia, unspecified: Secondary | ICD-10-CM | POA: Diagnosis not present

## 2021-11-29 DIAGNOSIS — M109 Gout, unspecified: Secondary | ICD-10-CM | POA: Diagnosis not present

## 2021-11-29 DIAGNOSIS — Z0000 Encounter for general adult medical examination without abnormal findings: Secondary | ICD-10-CM | POA: Diagnosis not present

## 2021-11-30 ENCOUNTER — Ambulatory Visit: Payer: Medicare Other | Admitting: Podiatry

## 2021-12-05 ENCOUNTER — Ambulatory Visit: Payer: Medicare Other | Admitting: Podiatry

## 2021-12-06 ENCOUNTER — Other Ambulatory Visit: Payer: Self-pay | Admitting: Internal Medicine

## 2021-12-06 ENCOUNTER — Ambulatory Visit
Admission: RE | Admit: 2021-12-06 | Discharge: 2021-12-06 | Disposition: A | Discharge status: Home / Self Care | Payer: Medicare Other | Source: Ambulatory Visit | Attending: Internal Medicine | Admitting: Internal Medicine

## 2021-12-06 DIAGNOSIS — M5136 Other intervertebral disc degeneration, lumbar region: Secondary | ICD-10-CM | POA: Diagnosis not present

## 2021-12-06 DIAGNOSIS — R319 Hematuria, unspecified: Secondary | ICD-10-CM | POA: Diagnosis not present

## 2021-12-06 DIAGNOSIS — M5442 Lumbago with sciatica, left side: Secondary | ICD-10-CM

## 2021-12-06 DIAGNOSIS — M545 Low back pain, unspecified: Secondary | ICD-10-CM | POA: Diagnosis not present

## 2021-12-17 DIAGNOSIS — M47816 Spondylosis without myelopathy or radiculopathy, lumbar region: Secondary | ICD-10-CM | POA: Diagnosis not present

## 2021-12-20 DIAGNOSIS — M48061 Spinal stenosis, lumbar region without neurogenic claudication: Secondary | ICD-10-CM | POA: Diagnosis not present

## 2021-12-20 DIAGNOSIS — M47816 Spondylosis without myelopathy or radiculopathy, lumbar region: Secondary | ICD-10-CM | POA: Diagnosis not present

## 2021-12-20 DIAGNOSIS — M5126 Other intervertebral disc displacement, lumbar region: Secondary | ICD-10-CM | POA: Diagnosis not present

## 2021-12-24 ENCOUNTER — Ambulatory Visit: Payer: Medicare Other | Admitting: Podiatry

## 2021-12-31 DIAGNOSIS — M47816 Spondylosis without myelopathy or radiculopathy, lumbar region: Secondary | ICD-10-CM | POA: Diagnosis not present

## 2022-01-03 DIAGNOSIS — R319 Hematuria, unspecified: Secondary | ICD-10-CM | POA: Diagnosis not present

## 2022-02-04 ENCOUNTER — Other Ambulatory Visit: Payer: Self-pay | Admitting: Cardiology

## 2022-02-04 DIAGNOSIS — I739 Peripheral vascular disease, unspecified: Secondary | ICD-10-CM

## 2022-02-11 ENCOUNTER — Other Ambulatory Visit: Payer: Self-pay | Admitting: Cardiology

## 2022-02-11 DIAGNOSIS — I10 Essential (primary) hypertension: Secondary | ICD-10-CM

## 2022-05-16 DIAGNOSIS — E113291 Type 2 diabetes mellitus with mild nonproliferative diabetic retinopathy without macular edema, right eye: Secondary | ICD-10-CM | POA: Diagnosis not present

## 2022-05-16 DIAGNOSIS — H5203 Hypermetropia, bilateral: Secondary | ICD-10-CM | POA: Diagnosis not present

## 2022-05-16 DIAGNOSIS — H2513 Age-related nuclear cataract, bilateral: Secondary | ICD-10-CM | POA: Diagnosis not present

## 2022-05-25 ENCOUNTER — Ambulatory Visit (HOSPITAL_COMMUNITY)
Admission: EM | Admit: 2022-05-25 | Discharge: 2022-05-25 | Disposition: A | Discharge status: Home / Self Care | Payer: 59

## 2022-05-25 ENCOUNTER — Encounter (HOSPITAL_COMMUNITY): Payer: Self-pay

## 2022-05-25 DIAGNOSIS — R0981 Nasal congestion: Secondary | ICD-10-CM

## 2022-05-25 DIAGNOSIS — U071 COVID-19: Secondary | ICD-10-CM

## 2022-05-25 MED ORDER — FLUTICASONE PROPIONATE 50 MCG/ACT NA SUSP: 1.0000 | Freq: Every day | NASAL | 0 refills | Status: AC

## 2022-05-25 MED ORDER — MOLNUPIRAVIR EUA 200MG CAPSULE: 4.0000 | ORAL_CAPSULE | Freq: Two times a day (BID) | ORAL | 0 refills | Status: AC

## 2022-05-25 NOTE — ED Triage Notes (Signed)
Pt states cough for the past 5 days which resolved but pt states he is feeling weak.  Took 2 covid home tests one was negative and one was positive.

## 2022-05-25 NOTE — Discharge Instructions (Signed)
Start molnupiravir twice daily for 5 days as prescribed to help your body fight COVID.  Use Flonase to help with your congestion.  You can use over-the-counter medications as needed for additional symptom relief.  Please monitor your oxygen saturation at least daily or anytime you are feeling short of breath.  If this drops below 93% you should be reevaluated.  If below 90% you need to go to the emergency room.  If you are not feeling better by first thing next week please return here or see your primary care.  If you have any worsening symptoms including high fever, shortness of breath, chest pain, worsening cough, weakness, nausea/vomiting interfering with oral intake you should be seen immediately.

## 2022-05-25 NOTE — ED Provider Notes (Signed)
[place]    CSN: [id number] Arrival date & time: 05/25/22  1247      History   Chief Complaint Chief Complaint  Patient presents with   Cough    HPI Jeremy Keller is a 82 y.o. male.   Patient presents today with a 5-day history of URI symptoms.  Reports congestion, cough, generalized malaise.  Denies any fever, chest pain, shortness of breath, nausea, vomiting.  Denies any known sick contacts.  He has had COVID-19 vaccinations including boosters.  Reports that he went to the health department yesterday and obtained antigen COVID tests that were positive.  He has not had COVID in the past.  Denies any recent antibiotics or steroids.  He has been taking over-the-counter medications as needed without improvement of symptoms.  Denies any history of asthma, COPD.  He is a former smoker but quit many years ago.  He does have significant past medical history including coronary artery disease, hyperlipidemia, hypertension, diabetes, advanced age.    Past Medical History:  Diagnosis Date   Coronary artery disease    Diabetes mellitus without complication ([place])    Gout    Myocardial infarct [facility])     Patient Active Problem List   Diagnosis Date Noted   CAD 07/09/07: Cx-OM 3x15 Promus, Mid LAD50%, Large D2 90% mid. 08/11/2018   Essential hypertension 08/11/2018   Hypercholesteremia 08/11/2018   Hyperglycemia 08/11/2018   Claudication in peripheral vascular disease ([place]) 08/11/2018   Pain in left hip 07/09/2017   Trochanteric bursitis, left hip 04/10/2016   Gout     Past Surgical History:  Procedure Laterality Date   CARDIAC CATHETERIZATION     COLONOSCOPY     LOWER EXTREMITY ANGIOGRAPHY N/A 11/03/2018   Procedure: LOWER EXTREMITY ANGIOGRAPHY;  Surgeon: Adrian Prows, MD;  Location: [place] [facility];  Service: Cardiovascular;  Laterality: N/A;   LOWER EXTREMITY ANGIOGRAPHY Left 12/15/2018   Procedure: LOWER EXTREMITY ANGIOGRAPHY;  Surgeon: Nigel Mormon, MD;   Location: [place] [facility];  Service: Cardiovascular;  Laterality: Left;   PERIPHERAL VASCULAR ATHERECTOMY  11/03/2018   Procedure: PERIPHERAL VASCULAR ATHERECTOMY;  Surgeon: Adrian Prows, MD;  Location: [place] [facility];  Service: Cardiovascular;;   PERIPHERAL VASCULAR BALLOON ANGIOPLASTY  11/03/2018   Procedure: PERIPHERAL VASCULAR BALLOON ANGIOPLASTY;  Surgeon: Adrian Prows, MD;  Location: [place] [facility];  Service: Cardiovascular;;   PERIPHERAL VASCULAR INTERVENTION  12/15/2018   Procedure: PERIPHERAL VASCULAR INTERVENTION;  Surgeon: Nigel Mormon, MD;  Location: [place] [facility];  Service: Cardiovascular;;  left common iliac       Home Medications    Prior to Admission medications   Medication Sig Start Date End Date Taking? Authorizing Provider  clopidogrel (PLAVIX) 75 MG tablet Take 75 mg by mouth daily. 03/14/22  Yes [provider]  fluticasone (FLONASE) 50 MCG/ACT nasal spray Place 1 spray into both nostrils daily. 05/25/22  Yes Shalaine Payson K, PA-C  molnupiravir EUA (LAGEVRIO) 200 mg CAPS capsule Take 4 capsules (800 mg total) by mouth 2 (two) times daily for 5 days. 05/25/22 05/30/22 Yes Syvilla Martin, Derry Skill, PA-C  allopurinol (ZYLOPRIM) 100 MG tablet Take 100 mg by mouth 2 (two) times daily. 09/08/14   [provider]  amLODipine (NORVASC) 10 MG tablet TAKE 1 TABLET(10 MG) BY MOUTH DAILY 12/22/20   Adrian Prows, MD  aspirin EC 81 MG tablet Take 162 mg by mouth daily.     [provider]  atorvastatin (LIPITOR) 20 MG  tablet Take 20 mg by mouth daily.  03/01/16   [provider]  Blood Glucose Monitoring Suppl (ONETOUCH VERIO REFLECT) w/Device KIT See admin instructions. 08/17/20   [provider]  diclofenac Sodium (VOLTAREN) 1 % GEL See admin instructions.    [provider]  gabapentin (NEURONTIN) 100 MG capsule Take 100 mg by mouth at bedtime.  02/27/16   [provider]  glimepiride (AMARYL) 1 MG tablet Take 1 mg by mouth  daily with breakfast.    [provider]  glucose blood (ONETOUCH VERIO) test strip See admin instructions. 08/17/20   [provider]  labetalol (NORMODYNE) 100 MG tablet TAKE 1 TABLET(100 MG) BY MOUTH TWICE DAILY 02/11/22   Adrian Prows, MD  Lancets [facility] DELICA PLUS [id number]) [place] Apply topically 2 (two) times daily. 12/10/20   [provider]  losartan (COZAAR) 100 MG tablet Take 100 mg by mouth daily. 09/08/14   [provider]  meclizine (ANTIVERT) 25 MG tablet Take 25 mg by mouth 3 (three) times daily as needed. 08/23/21   [provider]  [facility] VERIO test strip 2 (two) times daily. 12/10/20   [provider]  pantoprazole (PROTONIX) 40 MG tablet Take 40 mg by mouth daily.    [provider]  tamsulosin (FLOMAX) 0.4 MG CAPS capsule Take 0.4 mg by mouth daily. 12/12/19   [provider]    Family History Family History  Problem Relation Age of Onset   Heart attack Sister    Lung cancer Brother     Social History Social History   Tobacco Use   Smoking status: Former    Packs/day: 1.00    Years: 12.00    Total pack years: 12.00    Types: Cigarettes    Quit date: 2007    Years since quitting: 17.1   Smokeless tobacco: Never   Tobacco comments:    quit in 2002  Vaping Use   Vaping Use: Never used  Substance Use Topics   Alcohol use: Yes    Alcohol/week: 1.0 standard drink of alcohol    Types: 1 Glasses of wine per week    Comment: occasional   Drug use: No     Allergies   Metformin   Review of Systems Review of Systems  Constitutional:  Positive for activity change. Negative for appetite change, fatigue and fever.  HENT:  Positive for congestion. Negative for sinus pressure, sneezing and sore throat.   Respiratory:  Positive for cough. Negative for shortness of breath.   Cardiovascular:  Negative for chest pain.  Gastrointestinal:  Negative for abdominal pain, diarrhea, nausea and vomiting.      Physical Exam Triage Vital Signs ED Triage Vitals  Enc Vitals Group     BP 05/25/22 1500 (!) 140/70     Pulse Rate 05/25/22 1500 83     Resp 05/25/22 1500 16     Temp 05/25/22 1500 97.8 F (36.6 C)     Temp Source 05/25/22 1500 Oral     SpO2 05/25/22 1500 92 %     Weight --      Height --      Head Circumference --      Peak Flow --      Pain Score 05/25/22 1502 0     Pain Loc --      Pain Edu? --      Excl. in [place]? --    No data found.  Updated Vital Signs BP (!) 140/70 (BP  Location: Right Arm)   Pulse 73   Temp 97.8 F (36.6 C) (Oral)   Resp 16   SpO2 96%   Visual Acuity Right Eye Distance:   Left Eye Distance:   Bilateral Distance:    Right Eye Near:   Left Eye Near:    Bilateral Near:     Physical Exam Vitals reviewed.  Constitutional:      General: He is awake.     Appearance: Normal appearance. He is well-developed. He is not ill-appearing.     Comments: Very pleasant male appears stated age in no acute distress sitting comfortably in exam room  HENT:     Head: Normocephalic and atraumatic.     Right Ear: External ear normal.     Left Ear: External ear normal.     Nose: Nose normal.     Mouth/Throat:     Dentition: Abnormal dentition.     Pharynx: Uvula midline. Posterior oropharyngeal erythema present. No oropharyngeal exudate.  Cardiovascular:     Rate and Rhythm: Normal rate and regular rhythm.     Heart sounds: Normal heart sounds, S1 normal and S2 normal. No murmur heard. Pulmonary:     Effort: Pulmonary effort is normal. No accessory muscle usage or respiratory distress.     Breath sounds: No stridor. Examination of the right-lower field reveals decreased breath sounds. Examination of the left-lower field reveals decreased breath sounds. Decreased breath sounds present. No wheezing, rhonchi or rales.  Neurological:     Mental Status: He is alert.  Psychiatric:        Behavior: Behavior is cooperative.      UC Treatments / Results   Labs (all labs ordered are listed, but only abnormal results are displayed) Labs Reviewed - No data to display  EKG   Radiology No results found.  Procedures Procedures (including critical care time)  Medications Ordered in UC Medications - No data to display  Initial Impression / Assessment and Plan / UC Course  I have reviewed the triage vital signs and the nursing notes.  Pertinent labs & imaging results that were available during my care of the patient were reviewed by me and considered in my medical decision making (see chart for details).     Patient is well-appearing, afebrile, nontoxic, nontachycardic, with oxygen saturation of 96%.  Chest x-ray was deferred as there were no adventitious lung sounds on exam.  Discussed that positive at-home COVID test likely needs this to the etiology of his symptoms.  He is within 5 days of symptom onset so we will start antiviral therapy given his risk factors.  We discussed potential utility of Paxlovid but he would have to hold/alternative multiple medications including amlodipine, atorvastatin, tamsulosin, clopidogrel.  Through shared decision-making ultimately he decided to try molnupiravir even though we discussed that it is less effective given he would not need to alter his medications and does not need blood work to monitor his kidney function.  This was sent to his pharmacy.  Recommended over-the-counter medication and he was given Flonase to help with his congestion.  Recommend he use a pulse oximeter at home to monitor his oxygen saturation (reports that he has 1 available at home) and if below 93% he should be reevaluated and if below 90% go to the emergency room.  Recommend close follow-up with his primary care first thing next week if symptoms have not resolved.  Discussed that if he has any worsening symptoms he needs to go to the emergency  room for further evaluation and management which he expressed understanding.  Final Clinical  Impressions(s) / UC Diagnoses   Final diagnoses:  COVID-19  Nasal congestion     Discharge Instructions      Start molnupiravir twice daily for 5 days as prescribed to help your body fight COVID.  Use Flonase to help with your congestion.  You can use over-the-counter medications as needed for additional symptom relief.  Please monitor your oxygen saturation at least daily or anytime you are feeling short of breath.  If this drops below 93% you should be reevaluated.  If below 90% you need to go to the emergency room.  If you are not feeling better by first thing next week please return here or see your primary care.  If you have any worsening symptoms including high fever, shortness of breath, chest pain, worsening cough, weakness, nausea/vomiting interfering with oral intake you should be seen immediately.    ED Prescriptions     Medication Sig Dispense Auth. Provider   molnupiravir EUA (LAGEVRIO) 200 mg CAPS capsule Take 4 capsules (800 mg total) by mouth 2 (two) times daily for 5 days. 40 capsule Earle Troiano K, PA-C   fluticasone (FLONASE) 50 MCG/ACT nasal spray Place 1 spray into both nostrils daily. 16 g Kesi Perrow K, PA-C      PDMP not reviewed this encounter.   Terrilee Croak, PA-C 05/25/22 1558

## 2022-05-30 DIAGNOSIS — E1151 Type 2 diabetes mellitus with diabetic peripheral angiopathy without gangrene: Secondary | ICD-10-CM | POA: Diagnosis not present

## 2022-05-30 DIAGNOSIS — I10 Essential (primary) hypertension: Secondary | ICD-10-CM | POA: Diagnosis not present

## 2022-05-30 DIAGNOSIS — E7800 Pure hypercholesterolemia, unspecified: Secondary | ICD-10-CM | POA: Diagnosis not present

## 2022-05-30 DIAGNOSIS — I2510 Atherosclerotic heart disease of native coronary artery without angina pectoris: Secondary | ICD-10-CM | POA: Diagnosis not present

## 2022-05-30 DIAGNOSIS — E11319 Type 2 diabetes mellitus with unspecified diabetic retinopathy without macular edema: Secondary | ICD-10-CM | POA: Diagnosis not present

## 2022-05-30 DIAGNOSIS — I493 Ventricular premature depolarization: Secondary | ICD-10-CM | POA: Diagnosis not present

## 2022-05-30 DIAGNOSIS — M109 Gout, unspecified: Secondary | ICD-10-CM | POA: Diagnosis not present

## 2022-05-30 DIAGNOSIS — K219 Gastro-esophageal reflux disease without esophagitis: Secondary | ICD-10-CM | POA: Diagnosis not present

## 2022-05-30 DIAGNOSIS — E1140 Type 2 diabetes mellitus with diabetic neuropathy, unspecified: Secondary | ICD-10-CM | POA: Diagnosis not present

## 2022-05-30 DIAGNOSIS — I252 Old myocardial infarction: Secondary | ICD-10-CM | POA: Diagnosis not present

## 2022-07-12 ENCOUNTER — Ambulatory Visit (HOSPITAL_COMMUNITY)
Admit: 2022-07-12 | Discharge: 2022-07-12 | Disposition: A | Discharge status: Home / Self Care | Payer: 59 | Attending: *Deleted | Admitting: *Deleted

## 2022-07-12 ENCOUNTER — Encounter (HOSPITAL_COMMUNITY): Payer: Self-pay | Admitting: Emergency Medicine

## 2022-07-12 ENCOUNTER — Telehealth (HOSPITAL_COMMUNITY): Payer: Self-pay | Admitting: Emergency Medicine

## 2022-07-12 ENCOUNTER — Ambulatory Visit (HOSPITAL_COMMUNITY)
Admission: EM | Admit: 2022-07-12 | Discharge: 2022-07-12 | Disposition: A | Discharge status: Home / Self Care | Payer: 59 | Attending: Emergency Medicine | Admitting: Emergency Medicine

## 2022-07-12 DIAGNOSIS — N50811 Right testicular pain: Secondary | ICD-10-CM | POA: Diagnosis not present

## 2022-07-12 DIAGNOSIS — N5089 Other specified disorders of the male genital organs: Secondary | ICD-10-CM

## 2022-07-12 LAB — POCT URINALYSIS DIPSTICK, ED / UC
Bilirubin Urine: NEGATIVE
Glucose, UA: NEGATIVE mg/dL
Hgb urine dipstick: NEGATIVE
Ketones, ur: NEGATIVE mg/dL
Leukocytes,Ua: NEGATIVE
Nitrite: NEGATIVE
Protein, ur: NEGATIVE mg/dL
Specific Gravity, Urine: 1.02 (ref 1.005–1.030)
Urobilinogen, UA: 1 mg/dL (ref 0.0–1.0)
pH: 7.5 (ref 5.0–8.0)

## 2022-07-12 MED ORDER — LEVOFLOXACIN 500 MG PO TABS: 500.0000 mg | ORAL_TABLET | Freq: Every day | ORAL | 0 refills | Status: AC

## 2022-07-12 NOTE — ED Notes (Signed)
This RN walked patient over to entrance A for ultrasound.

## 2022-07-12 NOTE — ED Triage Notes (Signed)
Pt reports pain in testicles for a couple days. Right testicle swollen. Took tylenol. Denies urinary problems. Denies injury to area.

## 2022-07-12 NOTE — ED Provider Notes (Signed)
[facility]    CSN: [id number] Arrival date & time: 07/12/22  1153      History   Chief Complaint Chief Complaint  Patient presents with   Testicle Pain    HPI Jeremy Keller is a 82 y.o. male.   Reports right side testicle pain and swelling for the past 3-4 days.  He denies any pain with urination, flank pain or fever.  He tried some Tylenol earlier for his pain and it did not really help.  Denies any nausea or vomiting.  Currently pain 8 out of 10.   The history is provided by the patient and medical records.  Testicle Pain Pertinent negatives include no chest pain and no shortness of breath.    Past Medical History:  Diagnosis Date   Coronary artery disease    Diabetes mellitus without complication    Gout    Myocardial infarct     Patient Active Problem List   Diagnosis Date Noted   CAD 07/09/07: Cx-OM 3x15 Promus, Mid LAD50%, Large D2 90% mid. 08/11/2018   Essential hypertension 08/11/2018   Hypercholesteremia 08/11/2018   Hyperglycemia 08/11/2018   Claudication in peripheral vascular disease 08/11/2018   Pain in left hip 07/09/2017   Trochanteric bursitis, left hip 04/10/2016   Gout     Past Surgical History:  Procedure Laterality Date   CARDIAC CATHETERIZATION     COLONOSCOPY     LOWER EXTREMITY ANGIOGRAPHY N/A 11/03/2018   Procedure: LOWER EXTREMITY ANGIOGRAPHY;  Surgeon: Yates Decamp, MD;  [facility];  Service: Cardiovascular;  Laterality: N/A;   LOWER EXTREMITY ANGIOGRAPHY Left 12/15/2018   Procedure: LOWER EXTREMITY ANGIOGRAPHY;  Surgeon: Elder Negus, MD;  [facility];  Service: Cardiovascular;  Laterality: Left;   PERIPHERAL VASCULAR ATHERECTOMY  11/03/2018   Procedure: PERIPHERAL VASCULAR ATHERECTOMY;  Surgeon: Yates Decamp, MD;  Location: [facility];  Service: Cardiovascular;;   PERIPHERAL VASCULAR BALLOON ANGIOPLASTY  11/03/2018   Procedure: PERIPHERAL VASCULAR BALLOON ANGIOPLASTY;  Surgeon:  Yates Decamp, MD;  [facility];  Service: Cardiovascular;;   PERIPHERAL VASCULAR INTERVENTION  12/15/2018   Procedure: PERIPHERAL VASCULAR INTERVENTION;  Surgeon: Elder Negus, MD;  [facility];  Service: Cardiovascular;;  left common iliac       Home Medications    Prior to Admission medications   Medication Sig Start Date End Date Taking? Authorizing Provider  allopurinol (ZYLOPRIM) 100 MG tablet Take 100 mg by mouth 2 (two) times daily. 09/08/14   [provider]  amLODipine (NORVASC) 10 MG tablet TAKE 1 TABLET(10 MG) BY MOUTH DAILY 12/22/20   Yates Decamp, MD  aspirin EC 81 MG tablet Take 162 mg by mouth daily.     [provider]  atorvastatin (LIPITOR) 20 MG tablet Take 20 mg by mouth daily.  03/01/16   [provider]  Blood Glucose Monitoring Suppl (ONETOUCH VERIO REFLECT) w/Device KIT See admin instructions. 08/17/20   [provider]  clopidogrel (PLAVIX) 75 MG tablet Take 75 mg by mouth daily. 03/14/22   [provider]  diclofenac Sodium (VOLTAREN) 1 % GEL See admin instructions.    [provider]  fluticasone (FLONASE) 50 MCG/ACT nasal spray Place 1 spray into both nostrils daily. 05/25/22   Raspet, Noberto Retort, PA-C  gabapentin (NEURONTIN) 100 MG capsule Take 100 mg by mouth at bedtime.  02/27/16   [provider]  glimepiride (AMARYL) 1 MG tablet Take 1 mg  by mouth daily with breakfast.    [provider]  glucose blood (ONETOUCH VERIO) test strip See admin instructions. 08/17/20   [provider]  labetalol (NORMODYNE) 100 MG tablet TAKE 1 TABLET(100 MG) BY MOUTH TWICE DAILY 02/11/22   Yates Decamp, MD  Lancets [facility] DELICA PLUS [place]) MISC Apply topically 2 (two) times daily. 12/10/20   [provider]  losartan (COZAAR) 100 MG tablet Take 100 mg by mouth daily. 09/08/14   [provider]  meclizine (ANTIVERT) 25 MG tablet Take 25 mg by mouth 3  (three) times daily as needed. 08/23/21   [provider]  [facility] VERIO test strip 2 (two) times daily. 12/10/20   [provider]  pantoprazole (PROTONIX) 40 MG tablet Take 40 mg by mouth daily.    [provider]  tamsulosin (FLOMAX) 0.4 MG CAPS capsule Take 0.4 mg by mouth daily. 12/12/19   [provider]    Family History Family History  Problem Relation Age of Onset   Heart attack Sister    Lung cancer Brother     Social History Social History   Tobacco Use   Smoking status: Former    Packs/day: 1.00    Years: 12.00    Additional pack years: 0.00    Total pack years: 12.00    Types: Cigarettes    Quit date: 2007    Years since quitting: 17.2   Smokeless tobacco: Never   Tobacco comments:    quit in 2002  Vaping Use   Vaping Use: Never used  Substance Use Topics   Alcohol use: Yes    Alcohol/week: 1.0 standard drink of alcohol    Types: 1 Glasses of wine per week    Comment: occasional   Drug use: No     Allergies   Metformin   Review of Systems Review of Systems  Constitutional:  Negative for chills, fatigue and fever.  HENT:  Negative for sore throat.   Respiratory:  Negative for shortness of breath.   Cardiovascular:  Negative for chest pain.  Gastrointestinal:  Negative for abdominal distention, nausea and vomiting.  Genitourinary:  Positive for scrotal swelling and testicular pain. Negative for decreased urine volume, difficulty urinating, dysuria, flank pain, frequency, genital sores, hematuria, penile discharge, penile pain, penile swelling and urgency.     Physical Exam Triage Vital Signs ED Triage Vitals  Enc Vitals Group     BP 07/12/22 1321 122/75     Pulse Rate 07/12/22 1321 71     Resp 07/12/22 1321 18     Temp 07/12/22 1321 97.6 F (36.4 C)     Temp Source 07/12/22 1321 Oral     SpO2 07/12/22 1321 95 %     Weight --      Height --      Head Circumference --      Peak Flow --      Pain Score  07/12/22 1319 8     Pain Loc --      Pain Edu? --      Excl. in [place]? --    No data found.  Updated Vital Signs BP 122/75 (BP Location: Right Arm)   Pulse 71   Temp 97.6 F (36.4 C) (Oral)   Resp 18   SpO2 95%   Visual Acuity Right Eye Distance:   Left Eye Distance:   Bilateral Distance:    Right Eye Near:   Left Eye Near:    Bilateral Near:  Physical Exam Vitals and nursing note reviewed. Exam conducted with a chaperone present.  Constitutional:      Appearance: Normal appearance.  HENT:     Head: Normocephalic and atraumatic.     Right Ear: External ear normal.     Left Ear: External ear normal.     Mouth/Throat:     Mouth: Mucous membranes are moist.  Eyes:     General: No scleral icterus.       Right eye: No discharge.        Left eye: No discharge.     Conjunctiva/sclera: Conjunctivae normal.  Cardiovascular:     Rate and Rhythm: Normal rate and regular rhythm.  Pulmonary:     Effort: Pulmonary effort is normal. No respiratory distress.  Genitourinary:    Penis: Normal and circumcised.      Testes:        Right: Tenderness and swelling present.  Musculoskeletal:        General: No swelling. Normal range of motion.  Skin:    General: Skin is warm and dry.     Capillary Refill: Capillary refill takes less than 2 seconds.  Neurological:     General: No focal deficit present.     Mental Status: He is alert and oriented to person, place, and time.  Psychiatric:        Mood and Affect: Mood normal.        Behavior: Behavior is cooperative.      UC Treatments / Results  Labs (all labs ordered are listed, but only abnormal results are displayed) Labs Reviewed  POCT URINALYSIS DIPSTICK, ED / UC    EKG   Radiology No results found.  Procedures Procedures (including critical care time)  Medications Ordered in UC Medications - No data to display  Initial Impression / Assessment and Plan / UC Course  I have reviewed the triage vital signs and  the nursing notes.  Pertinent labs & imaging results that were available during my care of the patient were reviewed by me and considered in my medical decision making (see chart for details).  Vitals in triage reviewed, patient is hemodynamically stable.  Physical exam reveals an enlarged and tender right testicle.  Denies dysuria, hematuria or flank pain.  Urinalysis negative in clinic.  Symptoms have been ongoing for 3 days, sent to obtain an outpatient scrotal ultrasound with Doppler.  Will call back with results and advise patient with appropriate follow-up.      Final Clinical Impressions(s) / UC Diagnoses   Final diagnoses:  Testicular pain, right  Testicular swelling, right   Discharge Instructions   None    ED Prescriptions   None    PDMP not reviewed this encounter.   Zaiyah Sottile, [place], [place] 07/12/22 1511

## 2022-07-12 NOTE — Telephone Encounter (Signed)
Ultrasound Doppler scrotum results received.  Negative for testicular torsion.  Results suggestive of epididymitis.  Patient is not married, is not sexually active.  Will send in 500 mg Levaquin once daily x 10 days. Patient verbalized understand.

## 2022-07-25 ENCOUNTER — Other Ambulatory Visit: Payer: Self-pay | Admitting: Cardiology

## 2022-07-25 DIAGNOSIS — I10 Essential (primary) hypertension: Secondary | ICD-10-CM

## 2022-09-26 ENCOUNTER — Ambulatory Visit: Payer: 59 | Admitting: Cardiology

## 2022-10-01 ENCOUNTER — Encounter: Payer: Self-pay | Admitting: Cardiology

## 2022-10-01 ENCOUNTER — Ambulatory Visit: Payer: Medicare Other | Admitting: Cardiology

## 2022-10-01 VITALS — BP 130/72 | HR 74 | Resp 16 | Ht 72.0 in | Wt 210.0 lb

## 2022-10-01 DIAGNOSIS — E7800 Pure hypercholesterolemia, unspecified: Secondary | ICD-10-CM | POA: Diagnosis not present

## 2022-10-01 DIAGNOSIS — I2510 Atherosclerotic heart disease of native coronary artery without angina pectoris: Secondary | ICD-10-CM | POA: Diagnosis not present

## 2022-10-01 DIAGNOSIS — I739 Peripheral vascular disease, unspecified: Secondary | ICD-10-CM | POA: Diagnosis not present

## 2022-10-01 DIAGNOSIS — I10 Essential (primary) hypertension: Secondary | ICD-10-CM | POA: Diagnosis not present

## 2022-10-01 NOTE — Progress Notes (Signed)
Primary Physician/Referring:  Georgann Housekeeper, MD  Patient ID: Jeremy Keller, male    DOB: 10-30-1940, 82 y.o.   MRN: [id number]  Chief Complaint  Patient presents with   Coronary artery disease involving native coronary artery of   Claudication in peripheral vascular disease (HCC)   Follow-up    1 year    HPI: Jeremy Keller  is a 82 y.o. male  with coronary artery disease and angioplasty and stenting to his circumflex coronary artery in 2009,  prior tobacco use disorder, he quit smoking in 2007, probably 10-pack-year history, hypertension, DM, hyperlipidemia and hyperglycemia. H/O PAD with claudication underwent staged intervention to right mid and distal SFA using Hawk One directional atherectomy on 11/03/2018 and also left common iliac artery stenting on 12/15/2018.    Patient is here on annual visit for PAD and CAD, states that symptoms of claudication are very mild with occasional cramping in his legs but otherwise remains asymptomatic.  Denies chest pain or dyspnea.  Past Medical History:  Diagnosis Date   Coronary artery disease    Diabetes mellitus without complication (HCC)    Gout    Myocardial infarct [facility])     Past Surgical History:  Procedure Laterality Date   CARDIAC CATHETERIZATION     COLONOSCOPY     LOWER EXTREMITY ANGIOGRAPHY N/A 11/03/2018   Procedure: LOWER EXTREMITY ANGIOGRAPHY;  Surgeon: Yates Decamp, MD;  Location: [facility] INVASIVE CV LAB;  Service: Cardiovascular;  Laterality: N/A;   LOWER EXTREMITY ANGIOGRAPHY Left 12/15/2018   Procedure: LOWER EXTREMITY ANGIOGRAPHY;  Surgeon: Elder Negus, MD;  Location: [facility] INV[facility];  Service: Cardiovascular;  Laterality: Left;   PERIPHERAL VASCULAR ATHERECTOMY  11/03/2018   Procedure: PERIPHERAL VASCULAR ATHERECTOMY;  Surgeon: Yates Decamp, MD;  Location: [facility] INVASIVE CV LAB;  Service: Cardiovascular;;   PERIPHERAL VASCULAR BALLOON ANGIOPLASTY  11/03/2018   Procedure: PERIPHERAL VASCULAR BALLOON ANGIOPLASTY;  Surgeon: Yates Decamp, MD;  Location: [facility];  Service: Cardiovascular;;   PERIPHERAL VASCULAR INTERVENTION  12/15/2018   Procedure: PERIPHERAL VASCULAR INTERVENTION;  Surgeon: Elder Negus, MD;  Location: [facility];  Service: Cardiovascular;;  left common iliac   Social History   Tobacco Use   Smoking status: Former    Packs/day: 1.00    Years: 12.00    Additional pack years: 0.00    Total pack years: 12.00    Types: Cigarettes    Quit date: 2007    Years since quitting: 17.5   Smokeless tobacco: Never   Tobacco comments:    quit in 2002  Substance Use Topics   Alcohol use: Yes    Alcohol/week: 1.0 standard drink of alcohol    Types: 1 Glasses of wine per week    Comment: occasional  Marital Status: Divorced   Review of Systems  Cardiovascular:  Positive for claudication. Negative for chest pain, dyspnea on exertion and leg swelling.  Gastrointestinal:  Negative for melena.    Objective  Blood pressure 130/72, pulse 74, resp. rate 16, height 6' (1.829 m), weight 210 lb (95.3 kg), SpO2 94 %. Body mass index is 28.48 kg/m.    10/01/2022    9:54 AM 07/12/2022    1:21 PM 05/25/2022    3:50 PM  Vitals with BMI  Height 6\' 0"     Weight 210 lbs    BMI 28.47    Systolic 130 122   Diastolic 72 75   Pulse 74 71 73  Physical Exam Constitutional:      Appearance: He is well-developed.  Neck:     Vascular: No carotid bruit or JVD.  Cardiovascular:     Rate and Rhythm: Normal rate and regular rhythm.     Pulses: Decreased pulses.          Femoral pulses are 2+ on the right side and 1+ on the left side.      Popliteal pulses are 2+ on the right side and 2+ on the left side.       Dorsalis pedis pulses are 0 on the right side and 0 on the left side.       Posterior tibial pulses are 1+ on the right side and 0 on the left side.     Heart sounds: Normal heart sounds. No murmur heard.    No gallop.     Comments: Bilateral onychomycosis present Pulmonary:      Effort: Pulmonary effort is normal.     Breath sounds: Normal breath sounds.  Abdominal:     General: Bowel sounds are normal.     Palpations: Abdomen is soft.     Comments: Ventral hernia noted. Reducible  Musculoskeletal:     Right lower leg: No edema.     Left lower leg: No edema.      Laboratory examination:   External Labs    Labs 05/31/2022:  A1c 6.6%.  PSA 7.46.  Labs 11/30/2021:  Hb 15.0/HCT 44.7, platelets 224.  Serum glucose 92 mg, BUN 9, creatinine 1.03, EGFR 73 mL, potassium 4.0.  LFTs normal.  TSH normal at 1.92.  Total cholesterol 120, triglycerides 77, HDL 52, LDL 52.   Radiology:   No results found.  Cardiac Studies:   Abdominal aortic duplex 03/19/2017: The maximum aorta diameter is 2.76 cm (dist). Diffuse plaque observed in the mid and distal aorta. Normal flow velocities noted in the aorta and iliac arteries. No AAA noted.  Coronary angiogram 07/09/2007: Stenting of OM branch of circumflex with 3.0 x 15 mm Promus DES.  Left main ostial 20%, mid LAD 50% stenosis.  Diagonal 2, large has mid 90% stenosis.  Peripheral arteriogram   - 11/03/2018: Abdominal aortogram revealing mild atherosclerotic changes, 2 renal arteries (widely patent.  Left common iliac artery has a focal 70 to 75%, approximately followed by a 30 to 40% stenosis in the left external iliac artery.  Left SFA in the mid segment has a 50 to 60% stenosis in a tandem fashion.  Left popliteal artery is diffusely diseased but patent with less than 20 to 30% stenosis.  Left anterior tibial, left peroneal is occluded, TP trunk is severely diseased around 80%, one-vessel runoff in the form of PT in the left leg below the knee.  Right common iliac, external iliac, CFA are widely patent with mild disease.  Right mid SFA 75% stenosis, right DP (and severely disease with 80% diffuse disease, right AT and peroneal artery are occluded, single-vessel involving the form of PT in the right leg below the  knee. Successful atherectomy with HawkOne LS right mid to distal SFA stenosis reduced from 25% to 0% with sluggish flow improved to brisk flow.  - 12/15/2018: Lt CIA: 90% ulcerates lesion. Distal Lt CIA: 50% lesion Mid to distal SFA: Calcific 60% tandem lesions, hemodynamically non-significant (no pressure gradient). Severe below the knee disease with one vessel runoff PT PTA and balloon expanding stent Omnilink 10.0 X 59 mm placement Left common iliac artery   11/03/2018   12/15/2018  Echocardiogram 08/23/2021:  Left ventricle cavity is normal in size. Mild concentric hypertrophy of the left ventricle. Normal global wall motion. Normal LV systolic function  with EF 56%. Doppler evidence of grade I (impaired) diastolic dysfunction, normal LAP.  Trileaflet aortic valve with no regurgitation. Mild aortic valve leaflet calcification.  Mild tricuspid regurgitation. Estimated pulmonary artery systolic pressure 29 mmHg.  No significant change compared to previous study in 2022.  Lower Extremity Arterial Duplex 08/23/2021: Right mid superficial femoral artery to distal superficial femoral artery atherectomy patent.  Left external iliac artery stent patent.  No hemodynamically significant stenoses are identified in the right lower extremity arterial system.  Left DFA stenosis >50%.  This exam reveals normal perfusion of the right lower extremity (ABI 1.03).  Biphasic waveform. This exam reveals normal perfusion of the left lower extremity (ABI 0.99). Biphasic waveform.  PCV MYOCARDIAL PERFUSION WO LEXISCAN 08/29/2021  Narrative Lexiscan (with Mod Bruce protocol) Nuclear stress test 08/29/2021: Nondiagnostic ECG stress. The heart rate response was consistent with Regadenoson. Myocardial perfusion is normal. Overall LV systolic function is normal without regional wall motion abnormalities. Stress LV EF: 48%.  Visually LVEF appears normal. Compared to previous study: 10/23/2018,  previously no  diaphragmatic attenuation was noted and LVEF was 50%. Low risk.   EKG   EKG 10/01/2022: Normal sinus rhythm at the rate of 70 bpm, left anterior fascicular block.  Poor R wave progression, cannot exclude a anterolateral infarct old.  IVCD, borderline criteria for LVH.  Single PVC.  Compared to 08/13/2021, no significant change.   Allergies   Allergies  Allergen Reactions   Metformin     Other reaction(s): GI; diarrhrea    Current Outpatient Medications:    allopurinol (ZYLOPRIM) 100 MG tablet, Take 100 mg by mouth 2 (two) times daily., Disp: , Rfl: 0   amLODipine (NORVASC) 10 MG tablet, TAKE 1 TABLET(10 MG) BY MOUTH DAILY, Disp: 90 tablet, Rfl: 3   atorvastatin (LIPITOR) 20 MG tablet, Take 20 mg by mouth daily. , Disp: , Rfl:    Blood Glucose Monitoring Suppl (ONETOUCH VERIO REFLECT) w/Device KIT, See admin instructions., Disp: , Rfl:    clopidogrel (PLAVIX) 75 MG tablet, Take 75 mg by mouth daily., Disp: , Rfl:    diclofenac Sodium (VOLTAREN) 1 % GEL, See admin instructions., Disp: , Rfl:    fluticasone (FLONASE) 50 MCG/ACT nasal spray, Place 1 spray into both nostrils daily., Disp: 16 g, Rfl: 0   gabapentin (NEURONTIN) 100 MG capsule, Take 100 mg by mouth at bedtime. , Disp: , Rfl:    glimepiride (AMARYL) 1 MG tablet, Take 1 mg by mouth daily with breakfast., Disp: , Rfl:    glucose blood (ONETOUCH VERIO) test strip, See admin instructions., Disp: , Rfl:    labetalol (NORMODYNE) 100 MG tablet, TAKE 1 TABLET(100 MG) BY MOUTH TWICE DAILY, Disp: 180 tablet, Rfl: 1   Lancets (ONETOUCH DELICA PLUS LANCET33G) MISC, Apply topically 2 (two) times daily., Disp: , Rfl:    losartan (COZAAR) 100 MG tablet, Take 100 mg by mouth daily., Disp: , Rfl: 0   meclizine (ANTIVERT) 25 MG tablet, Take 25 mg by mouth 3 (three) times daily as needed., Disp: , Rfl:    ONETOUCH VERIO test strip, 2 (two) times daily., Disp: , Rfl:    pantoprazole (PROTONIX) 40 MG tablet, Take 40 mg by mouth daily., Disp: , Rfl:     tamsulosin (FLOMAX) 0.4 MG CAPS capsule, Take 0.4 mg by mouth daily., Disp: , Rfl:  Assessment     ICD-10-CM   1. Coronary artery disease involving native coronary artery of native heart without angina pectoris  I25.10 EKG 12-Lead    2. Claudication in peripheral vascular disease (HCC)  I73.9     3. Essential hypertension  I10     4. Hypercholesteremia  E78.00      No orders of the defined types were placed in this encounter.  Medications Discontinued During This Encounter  Medication Reason   aspirin EC 81 MG tablet Discontinued by provider      Recommendations:   Jeremy Keller  is a 82 y.o. with coronary artery disease and angioplasty and stenting to his circumflex coronary artery in 2009,  prior tobacco use disorder, he quit smoking in 2007, probably 10-pack-year history, hypertension, DM, hyperlipidemia and hyperglycemia. H/O PAD with claudication underwent staged intervention to right mid and distal SFA using Hawk One directional atherectomy on 11/03/2018 and also left common iliac artery stenting on 12/15/2018.    1. Coronary artery disease involving native coronary artery of native heart without angina pectoris Patient is presently doing well and has not had any episodes of angina pectoris.  No changes in his EKG.  Continue present medical management, he is on appropriate medical therapy including statins, Plavix ARB and a beta-blocker.  Will discontinue aspirin as he is on Plavix to reduce risk of bleeding.  Okay - EKG 12-Lead  2. Claudication in peripheral vascular disease (HCC) Symptoms of claudication are remained stable.  Mild decreased pulse especially in the left lower extremity but remains asymptomatic.  Continue present management and encouraged him to continue to walk and remain active.  3. Essential hypertension Blood pressure is well-controlled on present medical regimen.  Renal function has remained normal.  4. Hypercholesteremia Reviewed his external labs,  lipids under excellent control.  No changes in the medications were done by me.  I will see him back on annual basis.    Yates Decamp, MD, [facility] 10/01/2022, 10:33 AM Office: [phone] Fax: [phone] Pager: [phone]

## 2022-11-20 DIAGNOSIS — L821 Other seborrheic keratosis: Secondary | ICD-10-CM | POA: Diagnosis not present

## 2022-12-03 DIAGNOSIS — M109 Gout, unspecified: Secondary | ICD-10-CM | POA: Diagnosis not present

## 2022-12-03 DIAGNOSIS — I10 Essential (primary) hypertension: Secondary | ICD-10-CM | POA: Diagnosis not present

## 2022-12-03 DIAGNOSIS — K219 Gastro-esophageal reflux disease without esophagitis: Secondary | ICD-10-CM | POA: Diagnosis not present

## 2022-12-03 DIAGNOSIS — I2510 Atherosclerotic heart disease of native coronary artery without angina pectoris: Secondary | ICD-10-CM | POA: Diagnosis not present

## 2022-12-03 DIAGNOSIS — E11319 Type 2 diabetes mellitus with unspecified diabetic retinopathy without macular edema: Secondary | ICD-10-CM | POA: Diagnosis not present

## 2022-12-03 DIAGNOSIS — Z0000 Encounter for general adult medical examination without abnormal findings: Secondary | ICD-10-CM | POA: Diagnosis not present

## 2022-12-03 DIAGNOSIS — E1151 Type 2 diabetes mellitus with diabetic peripheral angiopathy without gangrene: Secondary | ICD-10-CM | POA: Diagnosis not present

## 2022-12-03 DIAGNOSIS — Z23 Encounter for immunization: Secondary | ICD-10-CM | POA: Diagnosis not present

## 2022-12-03 DIAGNOSIS — M1990 Unspecified osteoarthritis, unspecified site: Secondary | ICD-10-CM | POA: Diagnosis not present

## 2022-12-03 DIAGNOSIS — E7800 Pure hypercholesterolemia, unspecified: Secondary | ICD-10-CM | POA: Diagnosis not present

## 2022-12-03 DIAGNOSIS — E1140 Type 2 diabetes mellitus with diabetic neuropathy, unspecified: Secondary | ICD-10-CM | POA: Diagnosis not present

## 2023-03-03 ENCOUNTER — Other Ambulatory Visit: Payer: Self-pay | Admitting: Cardiology

## 2023-03-03 DIAGNOSIS — I10 Essential (primary) hypertension: Secondary | ICD-10-CM

## 2023-03-24 ENCOUNTER — Telehealth: Payer: Self-pay | Admitting: Cardiology

## 2023-03-24 DIAGNOSIS — E1151 Type 2 diabetes mellitus with diabetic peripheral angiopathy without gangrene: Secondary | ICD-10-CM | POA: Diagnosis not present

## 2023-03-24 DIAGNOSIS — R21 Rash and other nonspecific skin eruption: Secondary | ICD-10-CM | POA: Diagnosis not present

## 2023-03-24 DIAGNOSIS — R600 Localized edema: Secondary | ICD-10-CM | POA: Diagnosis not present

## 2023-03-24 DIAGNOSIS — E1140 Type 2 diabetes mellitus with diabetic neuropathy, unspecified: Secondary | ICD-10-CM | POA: Diagnosis not present

## 2023-03-24 NOTE — Telephone Encounter (Signed)
Pt c/o swelling/edema: STAT if pt has developed SOB within 24 hours  If swelling, where is the swelling located? Back of legs with red spots. Has had LE Angio in the past.  How much weight have you gained and in what time span? no  Have you gained 2 pounds in a day or 5 pounds in a week? no  Do you have a log of your daily weights (if so, list)? no  Are you currently taking a fluid pill? no  Are you currently SOB? no  Have you traveled recently in a car or plane for an extended period of time? no

## 2023-03-24 NOTE — Telephone Encounter (Signed)
Agree, if we get a call back, we can try Lasix 40 mg daily for 4 days with KCL 20 mEq daily

## 2023-03-24 NOTE — Telephone Encounter (Signed)
I spoke with patient.  He reports a little bit of swelling in his calf area.  Swelling in left leg started about 10 days ago. Swelling in right leg started a couple days ago.  Swelling in left calf is greater than right.  No recent long car rides or plane travel.  He has red spots from his calf to his ankle on both legs.  Calves are aching.  Aching occurs at rest.  No leg pain when walking.  Reddened areas are painful and itchy.  No new detergent or soap.  I advised patient to be seen at PCP or Urgent care.  Patient reports he will go to urgent care

## 2023-03-31 NOTE — Telephone Encounter (Signed)
Reached out to patient to see if he did go to urgent care or how his legs were doing if not. He states he saw his PCP who gave him "some cream" and the swelling has gone down some. Says not as bad. Informed him of Dr Verl Dicker recommendations  for furosemide and KCL and he states he wants to wait "a few more days to see if the spots get gone" but he will call us back if needed.

## 2023-06-04 DIAGNOSIS — E1140 Type 2 diabetes mellitus with diabetic neuropathy, unspecified: Secondary | ICD-10-CM | POA: Diagnosis not present

## 2023-06-04 DIAGNOSIS — M109 Gout, unspecified: Secondary | ICD-10-CM | POA: Diagnosis not present

## 2023-06-04 DIAGNOSIS — I252 Old myocardial infarction: Secondary | ICD-10-CM | POA: Diagnosis not present

## 2023-06-04 DIAGNOSIS — I10 Essential (primary) hypertension: Secondary | ICD-10-CM | POA: Diagnosis not present

## 2023-06-04 DIAGNOSIS — I739 Peripheral vascular disease, unspecified: Secondary | ICD-10-CM | POA: Diagnosis not present

## 2023-06-04 DIAGNOSIS — I2510 Atherosclerotic heart disease of native coronary artery without angina pectoris: Secondary | ICD-10-CM | POA: Diagnosis not present

## 2023-06-04 DIAGNOSIS — E11319 Type 2 diabetes mellitus with unspecified diabetic retinopathy without macular edema: Secondary | ICD-10-CM | POA: Diagnosis not present

## 2023-06-04 DIAGNOSIS — E7800 Pure hypercholesterolemia, unspecified: Secondary | ICD-10-CM | POA: Diagnosis not present

## 2023-06-04 DIAGNOSIS — L989 Disorder of the skin and subcutaneous tissue, unspecified: Secondary | ICD-10-CM | POA: Diagnosis not present

## 2023-09-02 DIAGNOSIS — H40033 Anatomical narrow angle, bilateral: Secondary | ICD-10-CM | POA: Diagnosis not present

## 2023-09-02 DIAGNOSIS — H5203 Hypermetropia, bilateral: Secondary | ICD-10-CM | POA: Diagnosis not present

## 2023-09-25 DIAGNOSIS — E7800 Pure hypercholesterolemia, unspecified: Secondary | ICD-10-CM | POA: Diagnosis not present

## 2023-09-25 DIAGNOSIS — I10 Essential (primary) hypertension: Secondary | ICD-10-CM | POA: Diagnosis not present

## 2023-09-25 DIAGNOSIS — I2510 Atherosclerotic heart disease of native coronary artery without angina pectoris: Secondary | ICD-10-CM | POA: Diagnosis not present

## 2023-09-25 DIAGNOSIS — I739 Peripheral vascular disease, unspecified: Secondary | ICD-10-CM | POA: Diagnosis not present

## 2023-09-25 DIAGNOSIS — M109 Gout, unspecified: Secondary | ICD-10-CM | POA: Diagnosis not present

## 2023-09-25 DIAGNOSIS — E1140 Type 2 diabetes mellitus with diabetic neuropathy, unspecified: Secondary | ICD-10-CM | POA: Diagnosis not present

## 2023-10-01 ENCOUNTER — Ambulatory Visit: Payer: 59 | Admitting: Cardiology

## 2023-10-28 DIAGNOSIS — M7062 Trochanteric bursitis, left hip: Secondary | ICD-10-CM | POA: Diagnosis not present

## 2023-11-03 ENCOUNTER — Telehealth: Payer: Self-pay | Admitting: Cardiology

## 2023-11-03 NOTE — Telephone Encounter (Signed)
 Pt c/o swelling/edema: STAT if pt has developed SOB within 24 hours  If swelling, where is the swelling located? Left leg swollen for 2-3 weeks.   How much weight have you gained and in what time span? no  Have you gained 2 pounds in a day or 5 pounds in a week? no  Do you have a log of your daily weights (if so, list)? no  Are you currently taking a fluid pill? no  Are you currently SOB? no  Have you traveled recently in a car or plane for an extended period of time? no

## 2023-11-03 NOTE — Telephone Encounter (Signed)
 Patient reports swelling in his left leg that started several weeks ago and has progressed. He reports that leg is painful. He denies any redness. He is not on a diuretic. Denies any shortness of breath. Patient with history of PVD. Patient is going to go be seen at urgent care.

## 2023-11-05 ENCOUNTER — Encounter (HOSPITAL_COMMUNITY): Payer: Self-pay

## 2023-11-05 ENCOUNTER — Ambulatory Visit (HOSPITAL_COMMUNITY)
Admission: RE | Admit: 2023-11-05 | Discharge: 2023-11-05 | Disposition: A | Discharge status: Home / Self Care | Source: Ambulatory Visit | Attending: Family Medicine | Admitting: Family Medicine

## 2023-11-05 ENCOUNTER — Ambulatory Visit: Payer: Self-pay

## 2023-11-05 ENCOUNTER — Telehealth (HOSPITAL_COMMUNITY): Payer: Self-pay | Admitting: *Deleted

## 2023-11-05 VITALS — BP 123/74 | HR 71 | Temp 97.7°F | Resp 18

## 2023-11-05 DIAGNOSIS — M79662 Pain in left lower leg: Secondary | ICD-10-CM

## 2023-11-05 DIAGNOSIS — M7989 Other specified soft tissue disorders: Secondary | ICD-10-CM

## 2023-11-05 NOTE — Telephone Encounter (Signed)
 Pt calling about ultrasound results from today

## 2023-11-05 NOTE — ED Provider Notes (Signed)
 [facility]   [id number] 11/05/23 Arrival Time: 0818  ASSESSMENT & PLAN:  1. Pain and swelling of left lower leg    Sent for U/S to r/o DVT given history. Will notify him of results once received. Otherwise will f/u with cardiologist.  Reviewed expectations re: course of current medical issues. Questions answered. Outlined signs and symptoms indicating need for more acute intervention. Patient verbalized understanding. After Visit Summary given.   SUBJECTIVE:  History from: patient. Jeremy Keller is a 83 y.o. male who presents with complaint of left leg swelling for 2-3 weeks. H/O DVT in this extremity x 2 per his report. Reports that his left hip is causing pain for about the same time.  Reports that his cardiologist has been inside left leg twice so called him to look at swelling but can't get in for several weeks and told to go get leg checked out.  Received Cortisone shot in left hip last Tuesday that hasn't helped. Pt reports was diagnosed with bursitis. Denies LE injury/trauma.   Social History   Tobacco Use  Smoking Status Former   Current packs/day: 0.00   Average packs/day: 1 pack/day for 12.0 years (12.0 ttl pk-yrs)   Types: Cigarettes   Start date: 73   Quit date: 2007   Years since quitting: 18.6  Smokeless Tobacco Never  Tobacco Comments   quit in 2002   Social History   Substance and Sexual Activity  Alcohol Use Yes   Alcohol/week: 1.0 standard drink of alcohol   Types: 1 Glasses of wine per week   Comment: occasional    OBJECTIVE:  Vitals:   11/05/23 0839  BP: 123/74  Pulse: 71  Resp: 18  Temp: 97.7 F (36.5 C)  TempSrc: Oral  SpO2: 95%    General appearance: alert, oriented, no acute distress Heart: regular Abdomen: soft, non-tender; no guarding or rebound tenderness Extremities: LLE does appear slightly swollen when compared to the RLE; no specific calf TTP Skin: warm and dry; without rash or lesions Neuro: normal  gait Psychological: alert and cooperative; normal mood and affect    Allergies  Allergen Reactions   Metformin     Other reaction(s): GI; diarrhrea    Past Medical History:  Diagnosis Date   Coronary artery disease    Diabetes mellitus without complication (HCC)    Gout    Myocardial infarct (HCC)    Social History   Socioeconomic History   Marital status: Divorced    Spouse name: Not on file   Number of children: 14   Years of education: Not on file   Highest education level: Not on file  Occupational History   Not on file  Tobacco Use   Smoking status: Former    Current packs/day: 0.00    Average packs/day: 1 pack/day for 12.0 years (12.0 ttl pk-yrs)    Types: Cigarettes    Start date: 47    Quit date: 2007    Years since quitting: 18.6   Smokeless tobacco: Never   Tobacco comments:    quit in 2002  Vaping Use   Vaping status: Never Used  Substance and Sexual Activity   Alcohol use: Yes    Alcohol/week: 1.0 standard drink of alcohol    Types: 1 Glasses of wine per week    Comment: occasional   Drug use: No   Sexual activity: Not on file  Other Topics Concern   Not on file  Social History Narrative   Not on file  Social Drivers of Corporate investment banker Strain: Not on file  Food Insecurity: Not on file  Transportation Needs: Not on file  Physical Activity: Not on file  Stress: Not on file  Social Connections: Not on file  Intimate Partner Violence: Not on file   Family History  Problem Relation Age of Onset   Heart attack Sister    Lung cancer Brother    Past Surgical History:  Procedure Laterality Date   CARDIAC CATHETERIZATION     COLONOSCOPY     LOWER EXTREMITY ANGIOGRAPHY N/A 11/03/2018   Procedure: LOWER EXTREMITY ANGIOGRAPHY;  Surgeon: Ladona Heinz, MD;  Location: [facility] INVASIVE CV LAB;  Service: Cardiovascular;  Laterality: N/A;   LOWER EXTREMITY ANGIOGRAPHY Left 12/15/2018   Procedure: LOWER EXTREMITY ANGIOGRAPHY;  Surgeon:  Elmira Newman PARAS, MD;  Location: [facility] INVASIVE CV [facility];  Service: Cardiovascular;  Laterality: Left;   PERIPHERAL VASCULAR ATHERECTOMY  11/03/2018   Procedure: PERIPHERAL VASCULAR ATHERECTOMY;  Surgeon: Ladona Heinz, MD;  Location: [facility] INVASIVE CV LAB;  Service: Cardiovascular;;   PERIPHERAL VASCULAR BALLOON ANGIOPLASTY  11/03/2018   Procedure: PERIPHERAL VASCULAR BALLOON ANGIOPLASTY;  Surgeon: Ladona Heinz, MD;  Location: [facility] INVASIVE CV LAB;  Service: Cardiovascular;;   PERIPHERAL VASCULAR INTERVENTION  12/15/2018   Procedure: PERIPHERAL VASCULAR INTERVENTION;  Surgeon: Elmira Newman PARAS, MD;  Location: [facility] INVAS[facility];  Service: Cardiovascular;;  left common iliac      Rolinda Rogue, MD 11/05/23 1439

## 2023-11-05 NOTE — ED Triage Notes (Signed)
 Pt c/o left leg swelling for 2-3 weeks. Reports that his left hip is causing pain for about the same time.  Reports that his cardiologist has been inside left leg twice so called him to look at swelling but can't get in for several weeks and told to go get leg checked out.  Pt got Cortisone shot in left hip last Tuesday that hasn't helped. Pt reports was diagnosed with bursitis. Pt reports spraying  Aleve and putting patches on hip area.  Pt denies any fall or injury.

## 2023-11-05 NOTE — Telephone Encounter (Signed)
 Per Dr Rolinda   We can inform him that preliminary results are negative. He may f/u with Dr Ladona cardiology    Pt aware.

## 2023-11-11 DIAGNOSIS — M7062 Trochanteric bursitis, left hip: Secondary | ICD-10-CM | POA: Diagnosis not present

## 2023-11-14 DIAGNOSIS — E1140 Type 2 diabetes mellitus with diabetic neuropathy, unspecified: Secondary | ICD-10-CM | POA: Diagnosis not present

## 2023-11-14 DIAGNOSIS — M7062 Trochanteric bursitis, left hip: Secondary | ICD-10-CM | POA: Diagnosis not present

## 2023-11-14 DIAGNOSIS — I10 Essential (primary) hypertension: Secondary | ICD-10-CM | POA: Diagnosis not present

## 2023-11-14 DIAGNOSIS — K219 Gastro-esophageal reflux disease without esophagitis: Secondary | ICD-10-CM | POA: Diagnosis not present

## 2023-11-14 DIAGNOSIS — Z888 Allergy status to other drugs, medicaments and biological substances status: Secondary | ICD-10-CM | POA: Diagnosis not present

## 2023-11-14 DIAGNOSIS — R42 Dizziness and giddiness: Secondary | ICD-10-CM | POA: Diagnosis not present

## 2023-11-14 DIAGNOSIS — E785 Hyperlipidemia, unspecified: Secondary | ICD-10-CM | POA: Diagnosis not present

## 2023-11-24 DIAGNOSIS — Z79899 Other long term (current) drug therapy: Secondary | ICD-10-CM | POA: Diagnosis not present

## 2023-11-24 DIAGNOSIS — G8929 Other chronic pain: Secondary | ICD-10-CM | POA: Diagnosis not present

## 2023-11-24 DIAGNOSIS — E1140 Type 2 diabetes mellitus with diabetic neuropathy, unspecified: Secondary | ICD-10-CM | POA: Diagnosis not present

## 2023-11-24 DIAGNOSIS — I739 Peripheral vascular disease, unspecified: Secondary | ICD-10-CM | POA: Diagnosis not present

## 2023-11-24 DIAGNOSIS — Z0001 Encounter for general adult medical examination with abnormal findings: Secondary | ICD-10-CM | POA: Diagnosis not present

## 2023-11-24 DIAGNOSIS — M25552 Pain in left hip: Secondary | ICD-10-CM | POA: Diagnosis not present

## 2023-11-24 DIAGNOSIS — G629 Polyneuropathy, unspecified: Secondary | ICD-10-CM | POA: Diagnosis not present

## 2023-11-24 DIAGNOSIS — I517 Cardiomegaly: Secondary | ICD-10-CM | POA: Diagnosis not present

## 2023-11-24 DIAGNOSIS — Z1159 Encounter for screening for other viral diseases: Secondary | ICD-10-CM | POA: Diagnosis not present

## 2023-11-24 DIAGNOSIS — R42 Dizziness and giddiness: Secondary | ICD-10-CM | POA: Diagnosis not present

## 2023-11-24 DIAGNOSIS — I480 Paroxysmal atrial fibrillation: Secondary | ICD-10-CM | POA: Diagnosis not present

## 2023-11-24 DIAGNOSIS — K219 Gastro-esophageal reflux disease without esophagitis: Secondary | ICD-10-CM | POA: Diagnosis not present

## 2023-12-02 ENCOUNTER — Telehealth: Payer: Self-pay | Admitting: Cardiology

## 2023-12-02 NOTE — Telephone Encounter (Signed)
 Pt c/o swelling: STAT is pt has developed SOB within 24 hours  How much weight have you gained and in what time span? Started 3 weeks ago If swelling, where is the swelling located? Left Leg  Are you currently taking a fluid pill? No, Only taking Advil and Tylenol  for the swelling Are you currently SOB? No  Do you have a log of your daily weights (if so, list)? No  Have you gained 3 pounds in a day or 5 pounds in a week? Unsure  Have you traveled recently? No  Patient stated his left leg is sore in his calf from the swelling,

## 2023-12-02 NOTE — Telephone Encounter (Signed)
 Please Rx furosemide  40 mg daily as needed, 10 tablets along with potassium 20 mEq daily as needed 10 tablets, for bilateral leg edema.  Advised patient to take 1 tablet daily for the next 5 days or until the leg edema is resolved.  He has an appointment to see me at the end of this month.

## 2023-12-02 NOTE — Telephone Encounter (Signed)
 Called pt in regards to left leg swelling.  Reports started about 2.5-3 weeks ago.    Had a DVT screen on 11/05/23.  Has hx of claudication in PVD.  Pt expresses swelling is from knee down.  Had a left hip injection for the first time about 3 weeks ago.  Denies SOB, difficulty taking a deep breath and wt gain expresses has lost about 3 lbs.  Expresses it's painful to walk on left leg at times.  If mashes on LLE it hurts.  Temperature of leg is same as rest of body.     Advised pt of ED precautions will send to MD to advise.  Scheduled overdue 1 yr f/u for 12/25/23 at 11:20 am.  Provided with office location all questions answered.

## 2023-12-03 MED ORDER — POTASSIUM CHLORIDE CRYS ER 20 MEQ PO TBCR: EXTENDED_RELEASE_TABLET | ORAL | 0 refills | Status: DC

## 2023-12-03 MED ORDER — FUROSEMIDE 40 MG PO TABS: ORAL_TABLET | ORAL | 0 refills | Status: DC

## 2023-12-03 NOTE — Telephone Encounter (Signed)
 Yes, DVD scan negative done in our office recently 11/05/23

## 2023-12-03 NOTE — Addendum Note (Signed)
 Addended by: DENTON BENDERS E on: 12/03/2023 03:13 PM   Modules accepted: Orders

## 2023-12-03 NOTE — Telephone Encounter (Signed)
 Called and spoke with patient. Patient states his swelling is a little better but still bothering him. Advised patient of Dr. Godfrey recommendations. Medication instructions given. Patient verbalized understanding and agreeable to plan. Prescriptions sent to stated preferred pharmacy.

## 2023-12-25 ENCOUNTER — Ambulatory Visit: Attending: Cardiology | Admitting: Cardiology

## 2023-12-25 ENCOUNTER — Encounter: Payer: Self-pay | Admitting: Cardiology

## 2023-12-25 VITALS — BP 116/74 | HR 69 | Ht 72.0 in | Wt 199.2 lb

## 2023-12-25 DIAGNOSIS — I2510 Atherosclerotic heart disease of native coronary artery without angina pectoris: Secondary | ICD-10-CM | POA: Diagnosis not present

## 2023-12-25 DIAGNOSIS — I739 Peripheral vascular disease, unspecified: Secondary | ICD-10-CM

## 2023-12-25 DIAGNOSIS — I10 Essential (primary) hypertension: Secondary | ICD-10-CM | POA: Diagnosis not present

## 2023-12-25 DIAGNOSIS — E7800 Pure hypercholesterolemia, unspecified: Secondary | ICD-10-CM

## 2023-12-25 NOTE — Patient Instructions (Signed)
 Medication Instructions:  Your physician recommends that you continue on your current medications as directed. Please refer to the Current Medication list given to you today.  *If you need a refill on your cardiac medications before your next appointment, please call your pharmacy*  Lab Work: none If you have labs (blood work) drawn today and your tests are completely normal, you will receive your results only by: MyChart Message (if you have MyChart) OR A paper copy in the mail If you have any lab test that is abnormal or we need to change your treatment, we will call you to review the results.  Testing/Procedures: none  Follow-Up: At [facility], you and your health needs are our priority.  As part of our continuing mission to provide you with exceptional heart care, our providers are all part of one team.  This team includes your primary Cardiologist (physician) and Advanced Practice Providers or APPs (Physician Assistants and Nurse Practitioners) who all work together to provide you with the care you need, when you need it.  Your next appointment:   1 year(s)  Provider:   Dr. Ladona  We recommend signing up for the patient portal called MyChart.  Sign up information is provided on this After Visit Summary.  MyChart is used to connect with patients for Virtual Visits (Telemedicine).  Patients are able to view lab/test results, encounter notes, upcoming appointments, etc.  Non-urgent messages can be sent to your provider as well.   To learn more about what you can do with MyChart, go to [email or web address].   Other Instructions none

## 2023-12-25 NOTE — Progress Notes (Signed)
 Cardiology Office Note:  .   Date:  12/25/2023  ID:  Jeremy Keller, DOB 04/30/1940, MRN [id number] PCP: [facility] Providers Cardiologist:  None   History of Present Illness: Jeremy   Jeremy Keller is a 83 y.o. male  with coronary artery disease and angioplasty and stenting to his circumflex coronary artery in 2009,  prior tobacco use disorder, he quit smoking in 2007, probably 10-pack-year history, hypertension, DM, hyperlipidemia and hyperglycemia. H/O PAD with claudication underwent staged intervention to right mid and distal SFA using Hawk One directional atherectomy on 11/03/2018 and also left common iliac artery stenting on 12/15/2018.     Patient is here on annual visit for PAD and CAD.  He has developed significant degenerative hip disease but does not want to have any surgery.  States that he is presently doing well and denies any chest pain, dyspnea or leg edema.  Denies symptoms of claudication.  Cardiac Studies relevent.    Coronary angiogram 07/09/2007:  Stenting of OM branch of circumflex with 3.0 x 15 mm Promus DES.  Left main ostial 20%, mid LAD 50% stenosis.  Diagonal 2, large has mid 90% stenosis.   Lexiscan  (with Mod Bruce protocol) Nuclear stress test 08/29/2021: Nondiagnostic ECG stress. The heart rate response was consistent with Regadenoson . Myocardial perfusion is normal. Overall LV systolic function is normal without regional wall motion abnormalities. Stress LV EF: 48%.  Visually LVEF appears normal.  Peripheral arteriogram  11/03/2018 and 12/15/2018     12/15/2018     Discussed the use of AI scribe software for clinical note transcription with the patient, who gave verbal consent to proceed.  History of Present Illness Jeremy Keller is an 83 year old male with coronary artery disease who presents for cardiovascular follow-up.  He has a history of coronary artery disease with stents placed in 2009. In 2020, he underwent procedures to  address blockages in his right and left legs, with a stent placed in his groin. His current medications include amlodipine , losartan, labetalol , and atorvastatin. He experienced pain and swelling in his left lower leg, diagnosed as a rash, treated with a cream, and completed a five-day course of Lasix  for swelling. The left leg remains slightly swollen.   Labs   Lab Results  Component Value Date   CHOL 128 10/16/2018   HDL 60 10/16/2018   LDLCALC 51 10/16/2018   TRIG 83 10/16/2018   CHOLHDL 2.9 07/10/2007   No results found for: LIPOA  No results for input(s): NA, K, CL, CO2, GLUCOSE, BUN, CREATININE, CALCIUM, GFRNONAA, GFRAA in the last 8760 hours.  Lab Results  Component Value Date   ALT 26 10/16/2018   AST 28 10/16/2018   ALKPHOS 73 10/16/2018   BILITOT 0.6 10/16/2018      Latest Ref Rng & Units 12/15/2018    5:45 AM 10/16/2018   10:17 AM 09/22/2014    3:13 PM  CBC  WBC 4.0 - 10.5 K/uL 6.1  8.2  6.6   Hemoglobin 13.0 - 17.0 g/dL 84.9  84.1  85.5   Hematocrit 39.0 - 52.0 % 44.8  46.0  41.5   Platelets 150 - 400 K/uL 217  232  211    Lab Results  Component Value Date   HGBA1C 6.3 (H) 10/16/2018    Lab Results  Component Value Date   TSH 1.570 10/16/2018    Care everywhere/Faxed External Labs:  NA  ROS  Review of Systems  Cardiovascular:  Negative  for chest pain, dyspnea on exertion and leg swelling.   Physical Exam:   VS:  BP 116/74 (BP Location: Right Arm, Patient Position: Sitting, Cuff Size: Normal)   Pulse 69   Ht 6' (1.829 m)   Wt 199 lb 3.2 oz (90.4 kg)   SpO2 99%   BMI 27.02 kg/m    Wt Readings from Last 3 Encounters:  12/25/23 199 lb 3.2 oz (90.4 kg)  10/01/22 210 lb (95.3 kg)  09/26/21 207 lb 12.8 oz (94.3 kg)    BP Readings from Last 3 Encounters:  12/25/23 116/74  11/05/23 123/74  10/01/22 130/72   Physical Exam Neck:     Vascular: No carotid bruit or JVD.  Cardiovascular:     Rate and Rhythm: Normal rate and  regular rhythm.     Pulses: Intact distal pulses.          Popliteal pulses are 2+ on the right side and 2+ on the left side.       Posterior tibial pulses are 1+ on the right side and 1+ on the left side.     Heart sounds: Normal heart sounds. No murmur heard.    No gallop.  Pulmonary:     Effort: Pulmonary effort is normal.     Breath sounds: Normal breath sounds.  Abdominal:     General: Bowel sounds are normal.     Palpations: Abdomen is soft.  Musculoskeletal:     Right lower leg: No edema.     Left lower leg: No edema.    EKG:    EKG Interpretation Date/Time:  Thursday December 25 2023 11:00:36 EDT Ventricular Rate:  69 PR Interval:  202 QRS Duration:  128 QT Interval:  396 QTC Calculation: 424 R Axis:   -74  Text Interpretation: EKG 12/25/2023: Normal sinus rhythm at rate of 69 bpm, left anterior fascicular block.  IVCD, borderline criteria for LVH.  Poor R progression related to LAFB.  No evidence of ischemia.  Compared to 10/01/2022, no significant change. Confirmed by Jeremy Keller, Jeremy Keller ([id number]) on 12/25/2023 11:26:49 AM    ASSESSMENT AND PLAN: .      ICD-10-CM   1. Coronary artery disease involving native coronary artery of native heart without angina pectoris  I25.10     2. Peripheral artery disease  I73.9     3. Essential hypertension  I10 EKG 12-Lead    4. Hypercholesteremia  E78.00      Assessment & Plan Coronary artery disease, status post stent placement Coronary artery disease with stent placement in 2009. Currently asymptomatic with unchanged EKG from last year.  Peripheral arterial disease of bilateral lower extremities, status post stenting left CIA and Right SFA atherectomy Peripheral arterial disease with previous stenting in both lower extremities. No claudication symptoms. Strong pedal pulses indicate good blood flow.  Chronic left lower extremity swelling Chronic swelling in the left lower extremity likely related to mild varicose veins, dependant  edema and amlodipine  could be related. No evidence of blood clots. Strong pedal pulses indicate good blood flow.  Essential hypertension Essential hypertension managed with amlodipine  10 mg daily, losartan 100 mg daily, and labetalol  100 mg twice daily.  Hyperlipidemia Hyperlipidemia managed with atorvastatin 20 mg daily. LDL cholesterol should be maintained below 70 mg/dL. Recent cholesterol panel not available. - Ensure LDL cholesterol is less than 70 mg/dL.   Follow up: 1 year for PAD and CAD and if stable PRN  Signed,  Gordy Bergamo, MD, [facility] 12/25/2023, 9:46 PM [facility] [address] [place], [place] [address] Phone: [phone]. Fax:  [phone]

## 2024-01-06 ENCOUNTER — Other Ambulatory Visit: Payer: Self-pay | Admitting: Cardiology

## 2024-01-13 ENCOUNTER — Other Ambulatory Visit: Payer: Self-pay | Admitting: Cardiology

## 2024-02-24 ENCOUNTER — Other Ambulatory Visit: Payer: Self-pay | Admitting: Cardiology

## 2024-02-24 DIAGNOSIS — I10 Essential (primary) hypertension: Secondary | ICD-10-CM
